# Patient Record
Sex: Female | Born: 1985
Health system: Southern US, Community
[De-identification: ages and names within clinical notes are randomized; demographics above are authoritative.]

## PROBLEM LIST (undated history)

## (undated) DIAGNOSIS — F32A Depression, unspecified: Secondary | ICD-10-CM

## (undated) DIAGNOSIS — F329 Major depressive disorder, single episode, unspecified: Secondary | ICD-10-CM

## (undated) DIAGNOSIS — N390 Urinary tract infection, site not specified: Secondary | ICD-10-CM

## (undated) HISTORY — DX: Urinary tract infection, site not specified: N39.0

## (undated) HISTORY — PX: HERNIA REPAIR: SHX51

## (undated) HISTORY — DX: Depression, unspecified: F32.A

---
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
## 2006-04-02 ENCOUNTER — Ambulatory Visit: Payer: Self-pay | Admitting: Family Medicine

## 2006-06-02 ENCOUNTER — Ambulatory Visit: Payer: Self-pay | Admitting: Family Medicine

## 2006-06-10 ENCOUNTER — Encounter: Payer: Self-pay | Admitting: Internal Medicine

## 2006-06-10 DIAGNOSIS — Z8719 Personal history of other diseases of the digestive system: Secondary | ICD-10-CM | POA: Insufficient documentation

## 2006-06-10 DIAGNOSIS — Z9889 Other specified postprocedural states: Secondary | ICD-10-CM

## 2006-06-10 HISTORY — DX: Personal history of other diseases of the digestive system: Z87.19

## 2006-06-26 ENCOUNTER — Emergency Department: Payer: Self-pay | Admitting: Emergency Medicine

## 2006-07-29 ENCOUNTER — Telehealth (INDEPENDENT_AMBULATORY_CARE_PROVIDER_SITE_OTHER): Payer: Self-pay | Admitting: *Deleted

## 2006-10-21 ENCOUNTER — Telehealth (INDEPENDENT_AMBULATORY_CARE_PROVIDER_SITE_OTHER): Payer: Self-pay | Admitting: Internal Medicine

## 2006-12-10 ENCOUNTER — Ambulatory Visit: Payer: Self-pay | Admitting: Family Medicine

## 2006-12-10 DIAGNOSIS — N925 Other specified irregular menstruation: Secondary | ICD-10-CM | POA: Insufficient documentation

## 2006-12-10 DIAGNOSIS — N949 Unspecified condition associated with female genital organs and menstrual cycle: Secondary | ICD-10-CM

## 2006-12-10 DIAGNOSIS — R3919 Other difficulties with micturition: Secondary | ICD-10-CM | POA: Insufficient documentation

## 2006-12-10 LAB — CONVERTED CEMR LAB
Beta hcg, urine, semiquantitative: POSITIVE
Ketones, urine, test strip: NEGATIVE
Nitrite: NEGATIVE
Protein, U semiquant: 30
Urobilinogen, UA: NEGATIVE

## 2006-12-27 ENCOUNTER — Encounter: Payer: Self-pay | Admitting: Maternal & Fetal Medicine

## 2007-01-10 ENCOUNTER — Encounter: Payer: Self-pay | Admitting: Maternal & Fetal Medicine

## 2007-02-24 ENCOUNTER — Inpatient Hospital Stay: Payer: Self-pay | Admitting: Obstetrics and Gynecology

## 2007-06-09 ENCOUNTER — Ambulatory Visit: Payer: Self-pay | Admitting: Family Medicine

## 2007-06-09 LAB — CONVERTED CEMR LAB: Rapid Strep: NEGATIVE

## 2007-06-16 ENCOUNTER — Observation Stay: Payer: Self-pay

## 2007-07-19 ENCOUNTER — Inpatient Hospital Stay: Payer: Self-pay

## 2009-10-10 ENCOUNTER — Encounter (INDEPENDENT_AMBULATORY_CARE_PROVIDER_SITE_OTHER): Payer: Self-pay | Admitting: *Deleted

## 2010-01-14 ENCOUNTER — Other Ambulatory Visit: Admission: RE | Admit: 2010-01-14 | Discharge: 2010-01-14 | Payer: Self-pay | Admitting: Family Medicine

## 2010-04-08 NOTE — Letter (Signed)
Summary: Nadara Eaton letter  [place] at [facility]  [address] [place], [place] [address]   Phone: [phone]  Fax: [phone]       10/10/2009 MRN: [id number]  LYGIA OLAES [address] [place], [place]  [address]  Dear Ms. [facility] announce the retirement of Arta Silence, M.D., from full-time practice at the [facility] office effective September 05, 2009 and his plans of returning part-time.  It is important to Dr. Hetty Ely and to our practice that you understand that [facility] has seven physicians in our office for your health care needs.  We will continue to offer the same exceptional care that you have today.    Dr. Hetty Ely has spoken to many of you about his plans for retirement and returning part-time in the fall.   We will continue to work with you through the transition to schedule appointments for you in the office and meet the high standards that [place] is committed to.   Again, it is with great pleasure that we share the news that Dr. Hetty Ely will return to [facility] at [facility] in October of 2011 with a reduced schedule.    If you have any questions, or would like to request an appointment with one of our physicians, please call us at [phone] and press the option for Scheduling an appointment.  We take pleasure in providing you with excellent patient care and look forward to seeing you at your next office visit.  Our [facility] Physicians are:  Tillman Abide, M.D. Laurita Quint, M.D. Roxy Manns, M.D. Kerby Nora, M.D. Hannah Beat, M.D. Ruthe Mannan, M.D. We proudly welcomed Raechel Ache, M.D. and Eustaquio Boyden, M.D. to the practice in July/August 2011.  Sincerely,   [facility]

## 2010-07-25 NOTE — Discharge Summary (Signed)
NAMEMEAGHAN, [name]:  [id number]   MEDICAL RECORD NO.:  [id number]          PATIENT TYPE:  IPS   LOCATION:  0506                          FACILITY:  BH   PHYSICIAN:  Geoffery Lyons, M.D.      DATE OF BIRTH:  1985-09-25   DATE OF ADMISSION:  05/28/2005  DATE OF DISCHARGE:  06/03/2005                                 DISCHARGE SUMMARY   CHIEF COMPLAINT AND PRESENT ILLNESS:  This was the first admission to the  adult unit but with a prior admission to the adolescent unit for this 25-  year-old single white female involuntarily committed.  Apparently, the  mother stated that her thoughts were rapid, disorganized.  She was having  pressured speech and she was threatening.   PAST PSYCHIATRIC HISTORY:  First time at [facility] but  was hospitalized at the adolescent unit March 04, 2004 to March 10, 2004.  Discharged with a diagnosis of depressive disorder not otherwise  specified, anxiety disorder not otherwise specified with generalized and  post-traumatic and panic features as well as cannabis dependence.   ALCOHOL/DRUG HISTORY:  Persistent use of marijuana.  No clear evidence of  any other substances.   MEDICAL HISTORY:  Noncontributory.   MEDICATIONS:  Trazodone 100 mg at bedtime, Zyprexa and Xanax, Adderall  (cannot remember the dose).  Questionable compliance.   MENTAL STATUS EXAM:  Fully alert female.  Upon initial evaluation, on May 29, 2005, presented to the unit, pacing, cursing, hyperverbal with racing  thoughts, requiring limit-setting.  Her behavior was somewhat bizarre and  his speech was mutter.  She continued to display tangential thinking,  observed with underpants on her head, singing loudly, saying that she was  fine, that she just needed her Adderall.  Pretty labile with stages of  laughter and hostility.   ADMISSION DIAGNOSES:  AXIS I:  Bipolar disorder, manic.  AXIS II:  No diagnosis.  AXIS III:  No diagnosis.  AXIS IV:  Moderate.  AXIS V:  GAF upon admission 25; highest GAF in the last year 60.   HOSPITAL COURSE:  She was admitted.  She was started in individual and group  psychotherapy.  She was given trazodone initially.  She was also given  Zyprexa Zydis.  She required some Geodon and Ativan and she was started on  Depakote.  Eventually, Zyprexa was changed to 10 mg in the morning and 10 mg  at night.  As already stated, she was admitted initially due to very bizarre  behavior, very labile, pressured speech, agitated, threatening, impulsive.  Wanting to leave the hospital, uncooperative, denying symptoms.  On the  25th, still irritable, labile, easily agitated, resistant to take Depakote,  somewhat tangential.  Very argumentative.  Talking out loud, singing and  then tearful.  On the 26th, she was having insomnia, racing thoughts,  agitation, pressured speech.  As she was starting to be compliant with  medication, claimed that she was ADHD and she was not bipolar, that the only  thing she needed was the Adderall.  She refused to take  the Depakote.  Endorsed that she liked the Seroquel better, so we switched from Zyprexa to  Seroquel.  Continued to have the pressured speech, agitated, some underlying  paranoia.  She was feeling that the males in the unit were looking at her.  On the 27th, she was still hypomanic, running around on the unit, somewhat  intrusive, believes another patient was stalking her.  Had to be redirected.  She wanted to be discharged on the 28th.  We contacted the mother that said  that she had seen some improvement.  She was back on Seroquel.  Willing to  take the Seroquel.  She felt ready to go home and go on with her life.  She  was indeed endorsing no suicidal or homicidal ideation.  No hallucinations.  No delusions.  Feeling better.  Objectively better.  No active suicidal or  homicidal ideation.  Starting to respond to medication.  Felt that she was  going to recover  faster if she was home.   DISCHARGE DIAGNOSES:  AXIS I:  Bipolar disorder, manic.  Attention-deficit  hyperactivity disorder.  AXIS II:  No diagnosis.  AXIS III:  No diagnosis.  AXIS IV:  Moderate.  AXIS V:  GAF upon discharge 50.   DISCHARGE MEDICATIONS:  1.  Seroquel 100 mg in the morning, 200 mg at night.  2.  Trazodone 100 mg at bedtime.   FOLLOW UP:  Chestine Spore and Jules Schick.      Geoffery Lyons, M.D.  Electronically Signed     IL/MEDQ  D:  06/21/2005  T:  06/22/2005  Job:  [id number]
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
## 2012-01-19 ENCOUNTER — Other Ambulatory Visit: Payer: Self-pay | Admitting: Family Medicine

## 2012-01-19 ENCOUNTER — Other Ambulatory Visit (HOSPITAL_COMMUNITY)
Admission: RE | Admit: 2012-01-19 | Discharge: 2012-01-19 | Disposition: A | Discharge status: Home / Self Care | Payer: Federal, State, Local not specified - PPO | Source: Ambulatory Visit | Attending: Family Medicine | Admitting: Family Medicine

## 2012-01-19 DIAGNOSIS — Z124 Encounter for screening for malignant neoplasm of cervix: Secondary | ICD-10-CM | POA: Insufficient documentation

## 2012-01-19 DIAGNOSIS — Z113 Encounter for screening for infections with a predominantly sexual mode of transmission: Secondary | ICD-10-CM | POA: Insufficient documentation

## 2013-07-02 ENCOUNTER — Emergency Department (HOSPITAL_BASED_OUTPATIENT_CLINIC_OR_DEPARTMENT_OTHER)
Admission: EM | Admit: 2013-07-02 | Discharge: 2013-07-02 | Disposition: A | Discharge status: Home / Self Care | Payer: BC Managed Care – PPO | Attending: Emergency Medicine | Admitting: Emergency Medicine

## 2013-07-02 ENCOUNTER — Encounter (HOSPITAL_BASED_OUTPATIENT_CLINIC_OR_DEPARTMENT_OTHER): Payer: Self-pay | Admitting: Emergency Medicine

## 2013-07-02 DIAGNOSIS — J3489 Other specified disorders of nose and nasal sinuses: Secondary | ICD-10-CM | POA: Insufficient documentation

## 2013-07-02 DIAGNOSIS — H109 Unspecified conjunctivitis: Secondary | ICD-10-CM | POA: Insufficient documentation

## 2013-07-02 DIAGNOSIS — Z91040 Latex allergy status: Secondary | ICD-10-CM | POA: Insufficient documentation

## 2013-07-02 DIAGNOSIS — F17200 Nicotine dependence, unspecified, uncomplicated: Secondary | ICD-10-CM | POA: Insufficient documentation

## 2013-07-02 DIAGNOSIS — Z23 Encounter for immunization: Secondary | ICD-10-CM | POA: Insufficient documentation

## 2013-07-02 MED ORDER — ERYTHROMYCIN 5 MG/GM OP OINT
TOPICAL_OINTMENT | Freq: Four times a day (QID) | OPHTHALMIC | Status: DC
  Administered 2013-07-02: 20:00:00 via OPHTHALMIC
  Filled 2013-07-02: qty 3.5

## 2013-07-02 MED ORDER — TETRACAINE HCL 0.5 % OP SOLN
1.0000 [drp] | Freq: Once | OPHTHALMIC | Status: AC
  Administered 2013-07-02: 1 [drp] via OPHTHALMIC
  Filled 2013-07-02: qty 2

## 2013-07-02 MED ORDER — TETANUS-DIPHTH-ACELL PERTUSSIS 5-2.5-18.5 LF-MCG/0.5 IM SUSP
0.5000 mL | Freq: Once | INTRAMUSCULAR | Status: AC
  Administered 2013-07-02: 0.5 mL via INTRAMUSCULAR
  Filled 2013-07-02: qty 0.5

## 2013-07-02 MED ORDER — FLUORESCEIN SODIUM 1 MG OP STRP
1.0000 | ORAL_STRIP | Freq: Once | OPHTHALMIC | Status: AC
  Administered 2013-07-02: 1 via OPHTHALMIC
  Filled 2013-07-02: qty 1

## 2013-07-02 NOTE — ED Notes (Signed)
Pt seen at walk in clinic.  Dr. Malachi Carlaykins called here and gave report.  Pt has having some confusion according to MD, pt received Toradol 60 mg IM around 1600.  MD concerned about mentation as she had to ask questions several times to pt before she answered.

## 2013-07-02 NOTE — ED Provider Notes (Signed)
CSN: [id number]     Arrival date & time 07/02/13  1724 History   First MD Initiated Contact with Patient 07/02/13 1800     Chief Complaint  Patient presents with  . Eye Pain     (Consider location/radiation/quality/duration/timing/severity/associated sxs/prior Treatment) Patient is a 28 y.o. female presenting with eye pain. The history is provided by the patient. No language interpreter was used.  Eye Pain This is a new problem. The current episode started today. Associated symptoms include congestion. Pertinent negatives include no fever. Associated symptoms comments: She had her contact lens in overnight and this morning the right eye is painful, red and feels like there is a FB in the upper lid. She has had excessive tearing but no matting or purulent drainage. Marland Kitchen.    History reviewed. No pertinent past medical history. Past Surgical History  Procedure Laterality Date  . Hernia repair     No family history on file. History  Substance Use Topics  . Smoking status: Current Every Day Smoker  . Smokeless tobacco: Not on file  . Alcohol Use: No   OB History   Grav Para Term Preterm Abortions TAB SAB Ect Mult Living                 Review of Systems  Constitutional: Negative for fever.  HENT: Positive for congestion.   Eyes: Positive for pain, redness and visual disturbance.      Allergies  Latex  Home Medications   Prior to Admission medications   Not on File   BP 118/65  Pulse 78  Temp(Src) 98 F (36.7 C)  Resp 14  Ht 5\' 8"  (1.727 m)  Wt 170 lb (77.111 kg)  BMI 25.85 kg/m2  SpO2 100%  LMP 06/30/2013 Physical Exam  Constitutional: She appears well-developed and well-nourished. No distress.  HENT:  No facial swelling.  Eyes: Conjunctivae are normal. Pupils are equal, round, and reactive to light.  Right eye injected, tearing. Questionable mild chemosis. No FB observed. No corneal abrasion on fluorescein uptake.   Pulmonary/Chest: Effort normal.    ED  Course  Procedures (including critical care time) Labs Review Labs Reviewed - No data to display  Imaging Review No results found.   EKG Interpretation None      MDM   Final diagnoses:  None    1. Conjunctivitis  No FB observed. Eye is generally red. No evidence iritis or corneal abrasion. Will give abx ointment and encourage F/U with her eye doctor if symptoms persist. No contacts for a week.     Arnoldo HookerShari A Lenorris Karger, PA-C 07/02/13 2335

## 2013-07-02 NOTE — ED Notes (Signed)
Slept in her contact lenses last night.  Her right eye itched this morning, she scratched it.  C/o pain ever since. Reports the eye has been draining as well.

## 2013-07-02 NOTE — Discharge Instructions (Signed)

## 2013-07-05 NOTE — ED Provider Notes (Signed)
Medical screening examination/treatment/procedure(s) were performed by non-physician practitioner and as supervising physician I was immediately available for consultation/collaboration.   EKG Interpretation None        Rolan BuccoMelanie Annalaura Sauseda, MD 07/05/13 [phone]

## 2014-07-07 ENCOUNTER — Encounter (HOSPITAL_BASED_OUTPATIENT_CLINIC_OR_DEPARTMENT_OTHER): Payer: Self-pay | Admitting: *Deleted

## 2014-07-07 ENCOUNTER — Emergency Department (HOSPITAL_BASED_OUTPATIENT_CLINIC_OR_DEPARTMENT_OTHER)
Admission: EM | Admit: 2014-07-07 | Discharge: 2014-07-07 | Disposition: A | Discharge status: Home / Self Care | Payer: BLUE CROSS/BLUE SHIELD | Attending: Emergency Medicine | Admitting: Emergency Medicine

## 2014-07-07 ENCOUNTER — Emergency Department (HOSPITAL_BASED_OUTPATIENT_CLINIC_OR_DEPARTMENT_OTHER): Payer: BLUE CROSS/BLUE SHIELD

## 2014-07-07 DIAGNOSIS — S8002XA Contusion of left knee, initial encounter: Secondary | ICD-10-CM | POA: Insufficient documentation

## 2014-07-07 DIAGNOSIS — Z720 Tobacco use: Secondary | ICD-10-CM | POA: Insufficient documentation

## 2014-07-07 DIAGNOSIS — Y9389 Activity, other specified: Secondary | ICD-10-CM | POA: Diagnosis not present

## 2014-07-07 DIAGNOSIS — Z91040 Latex allergy status: Secondary | ICD-10-CM | POA: Insufficient documentation

## 2014-07-07 DIAGNOSIS — S4991XA Unspecified injury of right shoulder and upper arm, initial encounter: Secondary | ICD-10-CM | POA: Diagnosis not present

## 2014-07-07 DIAGNOSIS — Y9289 Other specified places as the place of occurrence of the external cause: Secondary | ICD-10-CM | POA: Insufficient documentation

## 2014-07-07 DIAGNOSIS — S1081XA Abrasion of other specified part of neck, initial encounter: Secondary | ICD-10-CM | POA: Diagnosis not present

## 2014-07-07 DIAGNOSIS — S86812A Strain of other muscle(s) and tendon(s) at lower leg level, left leg, initial encounter: Secondary | ICD-10-CM | POA: Insufficient documentation

## 2014-07-07 DIAGNOSIS — S8392XA Sprain of unspecified site of left knee, initial encounter: Secondary | ICD-10-CM | POA: Insufficient documentation

## 2014-07-07 DIAGNOSIS — S29092A Other injury of muscle and tendon of back wall of thorax, initial encounter: Secondary | ICD-10-CM | POA: Diagnosis not present

## 2014-07-07 DIAGNOSIS — S299XXA Unspecified injury of thorax, initial encounter: Secondary | ICD-10-CM | POA: Diagnosis present

## 2014-07-07 DIAGNOSIS — S2231XA Fracture of one rib, right side, initial encounter for closed fracture: Secondary | ICD-10-CM | POA: Diagnosis not present

## 2014-07-07 DIAGNOSIS — Y998 Other external cause status: Secondary | ICD-10-CM | POA: Insufficient documentation

## 2014-07-07 MED ORDER — IBUPROFEN 800 MG PO TABS: 800.0000 mg | ORAL_TABLET | Freq: Three times a day (TID) | ORAL | Status: DC

## 2014-07-07 MED ORDER — TRAMADOL HCL 50 MG PO TABS: 50.0000 mg | ORAL_TABLET | Freq: Four times a day (QID) | ORAL | Status: DC | PRN

## 2014-07-07 NOTE — ED Notes (Signed)
MD at bedside. 
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
## 2014-07-07 NOTE — Discharge Instructions (Signed)
Joint Sprain A sprain is a tear or stretch in the ligaments that hold a joint together. Severe sprains may need as long as 3-6 weeks of immobilization and/or exercises to heal completely. Sprained joints should be rested and protected. If not, they can become unstable and prone to re-injury. Proper treatment can reduce your pain, shorten the period of disability, and reduce the risk of repeated injuries. TREATMENT   Rest and elevate the injured joint to reduce pain and swelling.  Apply ice packs to the injury for 20-30 minutes every 2-3 hours for the next 2-3 days.  Keep the injury wrapped in a compression bandage or splint as long as the joint is painful or as instructed by your caregiver.  Do not use the injured joint until it is completely healed to prevent re-injury and chronic instability. Follow the instructions of your caregiver.  Long-term sprain management may require exercises and/or treatment by a physical therapist. Taping or special braces may help stabilize the joint until it is completely better. SEEK MEDICAL CARE IF:   You develop increased pain or swelling of the joint.  You develop increasing redness and warmth of the joint.  You develop a fever.  It becomes stiff.  Your hand or foot gets cold or numb. Document Released: 04/02/2004 Document Revised: 05/18/2011 Document Reviewed: 03/12/2008 [facility] Patient Information 2015 [place], [place]. This information is not intended to replace advice given to you by your health care provider. Make sure you discuss any questions you have with your health care provider.  Rib Fracture A rib fracture is a break or crack in one of the bones of the ribs. The ribs are a group of long, curved bones that wrap around your chest and attach to your spine. They protect your lungs and other organs in the chest cavity. A broken or cracked rib is often painful, but most do not cause other problems. Most rib fractures heal on their own over time.  However, rib fractures can be more serious if multiple ribs are broken or if broken ribs move out of place and push against other structures. CAUSES   A direct blow to the chest. For example, this could happen during contact sports, a car accident, or a fall against a hard object.  Repetitive movements with high force, such as pitching a baseball or having severe coughing spells. SYMPTOMS   Pain when you breathe in or cough.  Pain when someone presses on the injured area. DIAGNOSIS  Your caregiver will perform a physical exam. Various imaging tests may be ordered to confirm the diagnosis and to look for related injuries. These tests may include a chest X-ray, computed tomography (CT), magnetic resonance imaging (MRI), or a bone scan. TREATMENT  Rib fractures usually heal on their own in 1-3 months. The longer healing period is often associated with a continued cough or other aggravating activities. During the healing period, pain control is very important. Medication is usually given to control pain. Hospitalization or surgery may be needed for more severe injuries, such as those in which multiple ribs are broken or the ribs have moved out of place.  HOME CARE INSTRUCTIONS   Avoid strenuous activity and any activities or movements that cause pain. Be careful during activities and avoid bumping the injured rib.  Gradually increase activity as directed by your caregiver.  Only take over-the-counter or prescription medications as directed by your caregiver. Do not take other medications without asking your caregiver first.  Apply ice to the injured  area for the first 1-2 days after you have been treated or as directed by your caregiver. Applying ice helps to reduce inflammation and pain.  Put ice in a plastic bag.  Place a towel between your skin and the bag.   Leave the ice on for 15-20 minutes at a time, every 2 hours while you are awake.  Perform deep breathing as directed by your  caregiver. This will help prevent pneumonia, which is a common complication of a broken rib. Your caregiver may instruct you to:  Take deep breaths several times a day.  Try to cough several times a day, holding a pillow against the injured area.  Use a device called an incentive spirometer to practice deep breathing several times a day.  Drink enough fluids to keep your urine clear or pale yellow. This will help you avoid constipation.   Do not wear a rib belt or binder. These restrict breathing, which can lead to pneumonia.  SEEK IMMEDIATE MEDICAL CARE IF:   You have a fever.   You have difficulty breathing or shortness of breath.   You develop a continual cough, or you cough up thick or bloody sputum.  You feel sick to your stomach (nausea), throw up (vomit), or have abdominal pain.   You have worsening pain not controlled with medications.  MAKE SURE YOU:  Understand these instructions.  Will watch your condition.  Will get help right away if you are not doing well or get worse. Document Released: 02/23/2005 Document Revised: 10/26/2012 Document Reviewed: 04/27/2012 [facility] Patient Information 2015 [place], [place]. This information is not intended to replace advice given to you by your health care provider. Make sure you discuss any questions you have with your health care provider.
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
## 2018-12-26 ENCOUNTER — Other Ambulatory Visit: Payer: Self-pay

## 2018-12-26 ENCOUNTER — Encounter: Payer: Self-pay | Admitting: Primary Care

## 2018-12-26 ENCOUNTER — Ambulatory Visit (INDEPENDENT_AMBULATORY_CARE_PROVIDER_SITE_OTHER): Payer: No Typology Code available for payment source | Admitting: Primary Care

## 2018-12-26 DIAGNOSIS — N390 Urinary tract infection, site not specified: Secondary | ICD-10-CM | POA: Insufficient documentation

## 2018-12-26 DIAGNOSIS — F329 Major depressive disorder, single episode, unspecified: Secondary | ICD-10-CM

## 2018-12-26 DIAGNOSIS — Z8719 Personal history of other diseases of the digestive system: Secondary | ICD-10-CM

## 2018-12-26 DIAGNOSIS — Z9889 Other specified postprocedural states: Secondary | ICD-10-CM | POA: Diagnosis not present

## 2018-12-26 DIAGNOSIS — F32A Depression, unspecified: Secondary | ICD-10-CM

## 2018-12-26 HISTORY — DX: Urinary tract infection, site not specified: N39.0

## 2018-12-26 NOTE — Assessment & Plan Note (Signed)
Recurrent in 2019 and early 2020, no recent UTI. Continue to monitor, continue with proper intake.

## 2018-12-26 NOTE — Patient Instructions (Signed)
It was a pleasure to meet you today! Please don't hesitate to call or message me with any questions. Welcome to [facility]!   

## 2018-12-26 NOTE — Assessment & Plan Note (Signed)
Sounds to be inguinal hernias that were repaired during childhood. Small pea sized bulge noted on exam without tenderness.  Since the newer bulge has been stable and unchanged for two years and is also not bothersome we will continue to monitor. She agrees.

## 2018-12-26 NOTE — Assessment & Plan Note (Signed)
Prior history, no recent recurrence. Denies concerns today, continue to monitor.

## 2018-12-26 NOTE — Progress Notes (Signed)
Subjective:    Patient ID: Shari Gallagher, female    DOB: Dec 06, 1985, 33 y.o.   MRN: [id number]  HPI  Shari Gallagher is a 33 year old female who presents today to establish care and discuss the problems mentioned below. Will obtain/review records.  1) Hernia: History of bilateral inguinal hernia which was repaired in 1995 with mesh. She has noticed a bulge to the left groin over the last two years, stable overall. She denies pain.   2) Bipolar Depression: Prior history of depression and was treated with Seroquel, Abilify, Trazodone, Xanax, Adderall at various times. Has been off of treatment for 10 years and is doing well.   3) Recurrent UTI: Chronic and intermittent since mid 2019. Three documented E coli positive cultures of acute cystitis since November 2019. Her last UTI was 3-4 months ago. During her CPE in March 2020 her previous PCP prescribed a 90 day supply of Macrobid because of recurrent positive cultre UTI. She really didn't take this as she decided to increase her water intake and has not had recurrent UTI since.   BP: 116/74    Review of Systems  Gastrointestinal: Negative for abdominal pain.  Genitourinary: Negative for dysuria and frequency.  Psychiatric/Behavioral:       Denies concerns for depression       Past Medical History:  Diagnosis Date  . Depression   . Urinary tract infection      Social History   Socioeconomic History  . Marital status: Single    Spouse name: Not on file  . Number of children: Not on file  . Years of education: Not on file  . Highest education level: Not on file  Occupational History  . Not on file  Social Needs  . Financial resource strain: Not on file  . Food insecurity    Worry: Not on file    Inability: Not on file  . Transportation needs    Medical: Not on file    Non-medical: Not on file  Tobacco Use  . Smoking status: Current Every Day Smoker    Packs/day: 1.00  . Smokeless tobacco: Never Used  Substance and  Sexual Activity  . Alcohol use: No  . Drug use: Not on file  . Sexual activity: Never    Birth control/protection: None  Lifestyle  . Physical activity    Days per week: Not on file    Minutes per session: Not on file  . Stress: Not on file  Relationships  . Social Herbalist on phone: Not on file    Gets together: Not on file    Attends religious service: Not on file    Active member of club or organization: Not on file    Attends meetings of clubs or organizations: Not on file    Relationship status: Not on file  . Intimate partner violence    Fear of current or ex partner: Not on file    Emotionally abused: Not on file    Physically abused: Not on file    Forced sexual activity: Not on file  Other Topics Concern  . Not on file  Social History Narrative  . Not on file    Past Surgical History:  Procedure Laterality Date  . HERNIA REPAIR  1995    Family History  Problem Relation Age of Onset  . Depression Mother   . Hypertension Mother   . Depression Father   . Diabetes Father   .  Hyperlipidemia Father   . Heart disease Maternal Grandfather   . Breast cancer Paternal Grandmother   . Lung cancer Paternal Grandfather   . Heart disease Paternal Grandfather     Allergies  Allergen Reactions  . Latex Rash    No current outpatient medications on file prior to visit.   No current facility-administered medications on file prior to visit.     BP 116/74   Pulse 73   Temp 97.9 F (36.6 C) (Temporal)   Ht 5\' 7"  (1.702 m)   Wt 221 lb 12 oz (100.6 kg)   LMP 12/19/2018   SpO2 100%   BMI 34.73 kg/m    Objective:   Physical Exam  Constitutional: She appears well-nourished.  Neck: Neck supple.  Cardiovascular: Normal rate and regular rhythm.  Respiratory: Effort normal and breath sounds normal.  Skin: Skin is warm and dry.  Psychiatric: She has a normal mood and affect.           Assessment & Plan:

## 2019-10-09 ENCOUNTER — Ambulatory Visit (INDEPENDENT_AMBULATORY_CARE_PROVIDER_SITE_OTHER): Payer: No Typology Code available for payment source | Admitting: Primary Care

## 2019-10-09 ENCOUNTER — Other Ambulatory Visit: Payer: Self-pay

## 2019-10-09 ENCOUNTER — Encounter: Payer: Self-pay | Admitting: Primary Care

## 2019-10-09 VITALS — BP 116/78 | HR 78 | Temp 96.0°F | Ht 67.0 in | Wt 224.8 lb

## 2019-10-09 DIAGNOSIS — R102 Pelvic and perineal pain: Secondary | ICD-10-CM | POA: Diagnosis not present

## 2019-10-09 DIAGNOSIS — N920 Excessive and frequent menstruation with regular cycle: Secondary | ICD-10-CM | POA: Diagnosis not present

## 2019-10-09 LAB — POC URINALSYSI DIPSTICK (AUTOMATED)
Bilirubin, UA: NEGATIVE
Blood, UA: NEGATIVE
Glucose, UA: NEGATIVE
Leukocytes, UA: NEGATIVE
Nitrite, UA: NEGATIVE
Protein, UA: NEGATIVE
Spec Grav, UA: 1.025 (ref 1.010–1.025)
Urobilinogen, UA: 0.2 E.U./dL
pH, UA: 6 (ref 5.0–8.0)

## 2019-10-09 NOTE — Progress Notes (Signed)
Subjective:    Patient ID: Shari Gallagher, female    DOB: [birth year], 34 y.o.   MRN: [id number]  HPI  This visit occurred during the SARS-CoV-2 public health emergency.  Safety protocols were in place, including screening questions prior to the visit, additional usage of staff PPE, and extensive cleaning of exam room while observing appropriate contact time as indicated for disinfecting solutions.   Shari Gallagher is a 34 year old female with a history of recurrent UTI, tobaco abuse, hernia repair who presents today with a chief complaint of pelvic pain.  Her pain is located to the bilateral upper pelvis for which she describes as an "electrocution" type pain. The pain is intermittent which began about one month ago. She also reports lower abdominal bloating and gas which began about the same time.  She has regular bowel movements once daily. Menstrual cycles are typically occur once monthly lasting 5-7 days, heavy for several days, but heavier than usual over the last month. She denies nausea, vomiting, diarrhea, bloody stools, family history of endometriosis or uterine fibroids, vaginal discharge/itching.  She underwent bilateral inguinal hernia repair during infancy.   Review of Systems  Constitutional: Negative for fever.  Gastrointestinal: Negative for blood in stool, constipation, diarrhea, nausea and vomiting.  Genitourinary: Positive for pelvic pain. Negative for dysuria, hematuria and vaginal discharge.       Menorrhagia with normal cycle       Past Medical History:  Diagnosis Date  . Depression   . Urinary tract infection      Social History   Socioeconomic History  . Marital status: Single    Spouse name: Not on file  . Number of children: Not on file  . Years of education: Not on file  . Highest education level: Not on file  Occupational History  . Not on file  Tobacco Use  . Smoking status: Current Every Day Smoker    Packs/day: 1.00  . Smokeless tobacco: Never  Used  Substance and Sexual Activity  . Alcohol use: No  . Drug use: Not on file  . Sexual activity: Never    Birth control/protection: None  Other Topics Concern  . Not on file  Social History Narrative  . Not on file   Social Determinants of Health   Financial Resource Strain:   . Difficulty of Paying Living Expenses:   Food Insecurity:   . Worried About Programme researcher, broadcasting/film/video in the Last Year:   . Barista in the Last Year:   Transportation Needs:   . Freight forwarder (Medical):   Marland Kitchen Lack of Transportation (Non-Medical):   Physical Activity:   . Days of Exercise per Week:   . Minutes of Exercise per Session:   Stress:   . Feeling of Stress :   Social Connections:   . Frequency of Communication with Friends and Family:   . Frequency of Social Gatherings with Friends and Family:   . Attends Religious Services:   . Active Member of Clubs or Organizations:   . Attends Banker Meetings:   Marland Kitchen Marital Status:   Intimate Partner Violence:   . Fear of Current or Ex-Partner:   . Emotionally Abused:   Marland Kitchen Physically Abused:   . Sexually Abused:     Past Surgical History:  Procedure Laterality Date  . HERNIA REPAIR  1995    Family History  Problem Relation Age of Onset  . Depression Mother   . Hypertension Mother   .  Depression Father   . Diabetes Father   . Hyperlipidemia Father   . Heart disease Maternal Grandfather   . Breast cancer Paternal Grandmother   . Lung cancer Paternal Grandfather   . Heart disease Paternal Grandfather     Allergies  Allergen Reactions  . Latex Rash    No current outpatient medications on file prior to visit.   No current facility-administered medications on file prior to visit.    BP 116/78   Pulse 78   Temp (!) 96 F (35.6 C) (Temporal)   Ht 5\' 7"  (1.702 m)   Wt (!) 224 lb 12 oz (101.9 kg)   LMP 09/20/2019   SpO2 98%   BMI 35.20 kg/m    Objective:   Physical Exam Cardiovascular:     Rate and  Rhythm: Normal rate and regular rhythm.  Pulmonary:     Effort: Pulmonary effort is normal.     Breath sounds: Normal breath sounds.  Abdominal:     General: Bowel sounds are normal.     Palpations: Abdomen is soft.     Comments: Pressure to suprapubic region with palpation.   Musculoskeletal:     Cervical back: Neck supple.  Skin:    General: Skin is warm and dry.            Assessment & Plan:

## 2019-10-09 NOTE — Patient Instructions (Signed)
You will be contacted regarding your ultrasound.  Please let us know if you have not been contacted within two weeks.   It was a pleasure to see you today!  

## 2019-10-09 NOTE — Assessment & Plan Note (Signed)
Over the last one month, with pelvic pain.  Transvaginal and pelvic ultrasound ordered and pending.   UA today negative. She denies vaginal discharge/itching, no pelvic exam completed.  Await results.

## 2019-10-09 NOTE — Assessment & Plan Note (Signed)
Unclear etiology, could be nerve type pain?  Checking transvaginal/pelvic ultrasound.   UA today negative.

## 2019-10-11 ENCOUNTER — Telehealth: Payer: Self-pay | Admitting: Primary Care

## 2019-10-11 ENCOUNTER — Ambulatory Visit
Admission: RE | Admit: 2019-10-11 | Discharge: 2019-10-11 | Disposition: A | Discharge status: Home / Self Care | Payer: No Typology Code available for payment source | Source: Ambulatory Visit | Attending: Primary Care | Admitting: Primary Care

## 2019-10-11 DIAGNOSIS — R102 Pelvic and perineal pain: Secondary | ICD-10-CM

## 2019-10-11 DIAGNOSIS — N920 Excessive and frequent menstruation with regular cycle: Secondary | ICD-10-CM

## 2019-10-11 NOTE — Telephone Encounter (Signed)
Patient call and wanted Shari Gallagher to know she had her ultrasound and the should be reaching out to you in the next day or so with those results.

## 2019-10-11 NOTE — Telephone Encounter (Signed)
Noted. Mayra Reel is the provider who ordered the [place] and will contact patient after she review.

## 2019-10-25 ENCOUNTER — Telehealth: Payer: Self-pay | Admitting: Primary Care

## 2019-10-25 NOTE — Telephone Encounter (Signed)
Please get more information. What is on her left hand?  Why is she wanting the referral?

## 2019-10-25 NOTE — Telephone Encounter (Signed)
Pt said she needs a referral to dermatology for her hands. She said it is on left hand.

## 2019-10-26 NOTE — Telephone Encounter (Signed)
Per DPR, left detail message of Kate Clark's comments for patient to call back 

## 2019-11-01 NOTE — Telephone Encounter (Signed)
Per DPR, left detail message of Kate Clark's comments for patient to call back 

## 2019-11-02 ENCOUNTER — Other Ambulatory Visit: Payer: Self-pay | Admitting: Obstetrics & Gynecology

## 2020-01-12 ENCOUNTER — Other Ambulatory Visit: Payer: Self-pay | Admitting: Obstetrics & Gynecology

## 2020-05-24 DIAGNOSIS — L600 Ingrowing nail: Secondary | ICD-10-CM | POA: Diagnosis not present

## 2020-05-24 DIAGNOSIS — M7752 Other enthesopathy of left foot: Secondary | ICD-10-CM | POA: Diagnosis not present

## 2020-05-24 DIAGNOSIS — L03032 Cellulitis of left toe: Secondary | ICD-10-CM | POA: Diagnosis not present

## 2020-05-24 DIAGNOSIS — M79675 Pain in left toe(s): Secondary | ICD-10-CM | POA: Diagnosis not present

## 2020-06-06 DIAGNOSIS — Z79899 Other long term (current) drug therapy: Secondary | ICD-10-CM | POA: Diagnosis not present

## 2020-06-14 DIAGNOSIS — M79675 Pain in left toe(s): Secondary | ICD-10-CM | POA: Diagnosis not present

## 2020-07-23 DIAGNOSIS — L600 Ingrowing nail: Secondary | ICD-10-CM | POA: Diagnosis not present

## 2020-07-23 DIAGNOSIS — B351 Tinea unguium: Secondary | ICD-10-CM | POA: Diagnosis not present

## 2020-07-30 DIAGNOSIS — B351 Tinea unguium: Secondary | ICD-10-CM | POA: Diagnosis not present

## 2020-10-14 ENCOUNTER — Encounter: Payer: Self-pay | Admitting: Physician Assistant

## 2020-10-14 ENCOUNTER — Telehealth: Payer: 59 | Admitting: Physician Assistant

## 2020-10-14 DIAGNOSIS — J069 Acute upper respiratory infection, unspecified: Secondary | ICD-10-CM | POA: Diagnosis not present

## 2020-10-14 MED ORDER — IPRATROPIUM BROMIDE 0.03 % NA SOLN: 2.0000 | Freq: Two times a day (BID) | NASAL | 12 refills | Status: DC

## 2020-10-14 MED ORDER — BENZONATATE 100 MG PO CAPS: 100.0000 mg | ORAL_CAPSULE | Freq: Three times a day (TID) | ORAL | 0 refills | Status: DC | PRN

## 2020-10-14 MED ORDER — ALBUTEROL SULFATE HFA 108 (90 BASE) MCG/ACT IN AERS: 2.0000 | INHALATION_SPRAY | Freq: Four times a day (QID) | RESPIRATORY_TRACT | 0 refills | Status: DC | PRN

## 2020-10-14 NOTE — Patient Instructions (Signed)
  Shari Gallagher, thank you for joining [name], PA-C for today's virtual visit.  While this provider is not your primary care provider (PCP), if your PCP is located in our provider database this encounter information will be shared with them immediately following your visit.  Consent: (Patient) Shari Gallagher provided verbal consent for this virtual visit at the beginning of the encounter.  Current Medications:  Current Outpatient Medications:    albuterol (VENTOLIN HFA) 108 (90 Base) MCG/ACT inhaler, Inhale 2 puffs into the lungs every 6 (six) hours as needed for wheezing or shortness of breath., Disp: 8 g, Rfl: 0   benzonatate (TESSALON) 100 MG capsule, Take 1 capsule (100 mg total) by mouth 3 (three) times daily as needed., Disp: 30 capsule, Rfl: 0   ipratropium (ATROVENT) 0.03 % nasal spray, Place 2 sprays into both nostrils every 12 (twelve) hours., Disp: 30 mL, Rfl: 12   Medications ordered in this encounter:  Meds ordered this encounter  Medications   benzonatate (TESSALON) 100 MG capsule    Sig: Take 1 capsule (100 mg total) by mouth 3 (three) times daily as needed.    Dispense:  30 capsule    Refill:  0    Order Specific Question:   Supervising Provider    Answer:   MILLER, BRIAN [[id number]]   ipratropium (ATROVENT) 0.03 % nasal spray    Sig: Place 2 sprays into both nostrils every 12 (twelve) hours.    Dispense:  30 mL    Refill:  12    Order Specific Question:   Supervising Provider    Answer:   Hyacinth Meeker, BRIAN [[id number]]   albuterol (VENTOLIN HFA) 108 (90 Base) MCG/ACT inhaler    Sig: Inhale 2 puffs into the lungs every 6 (six) hours as needed for wheezing or shortness of breath.    Dispense:  8 g    Refill:  0    Order Specific Question:   Supervising Provider    Answer:   Hyacinth Meeker, BRIAN [[id number]]     *If you need refills on other medications prior to your next appointment, please contact your pharmacy*  Follow-Up: Call back or seek an in-person evaluation if the  symptoms worsen or if the condition fails to improve as anticipated.  Other Instructions Continue Zyrtec. Can add flonase OTC if desired   If you have been instructed to have an in-person evaluation today at a local Urgent Care facility, please use the link below. It will take you to a list of all of our available [facility], including address, phone number and hours of operation. Please do not delay care.  [facility]  If you or a family member do not have a primary care provider, use the link below to schedule a visit and establish care. When you choose a [facility] primary care physician or advanced practice provider, you gain a long-term partner in health. Find a Primary Care Provider  Learn more about [facility]'s in-office and virtual care options: [facility] [facility] - Get Care Now

## 2020-10-14 NOTE — Progress Notes (Signed)
Virtual Visit Consent   [name], you are scheduled for a virtual visit with a [facility] provider today.     Just as with appointments in the office, your consent must be obtained to participate.  Your consent will be active for this visit and any virtual visit you may have with one of our providers in the next 365 days.     If you have a MyChart account, a copy of this consent can be sent to you electronically.  All virtual visits are billed to your insurance company just like a traditional visit in the office.    As this is a virtual visit, video technology does not allow for your provider to perform a traditional examination.  This may limit your provider's ability to fully assess your condition.  If your provider identifies any concerns that need to be evaluated in person or the need to arrange testing (such as labs, EKG, etc.), we will make arrangements to do so.     Although advances in technology are sophisticated, we cannot ensure that it will always work on either your end or our end.  If the connection with a video visit is poor, the visit may have to be switched to a telephone visit.  With either a video or telephone visit, we are not always able to ensure that we have a secure connection.     I need to obtain your verbal consent now.   Are you willing to proceed with your visit today?    Shari Gallagher has provided verbal consent on 10/14/2020 for a virtual visit (video or telephone).   [name], PA-C   Date: 10/14/2020 12:19 PM   Virtual Visit via Video Note   I, [name], connected with  Shari Gallagher  ([id number], 05-Feb-1986) on 10/14/20 at 12:15 PM EDT by a video-enabled telemedicine application and verified that I am speaking with the correct person using two identifiers.  Location: Patient: Virtual Visit Location Patient: Home Provider: Virtual Visit Location Provider: Home Office   I discussed the limitations of evaluation and management by  telemedicine and the availability of in person appointments. The patient expressed understanding and agreed to proceed.    History of Present Illness: Shari Gallagher is a 35 y.o. who identifies as a female who was assigned female at birth, and is being seen today for URI symptoms.  HPI: URI  This is a new problem. The current episode started in the past 7 days (Saturday). There has been no fever. Associated symptoms include coughing (dry), rhinorrhea, sneezing and a sore throat (scratchy). Pertinent negatives include no ear pain, headaches, plugged ear sensation, sinus pain or wheezing. Associated symptoms comments: Post nasal drainage. She has tried increased fluids and sleep (zyrtec, mucinex) for the symptoms. The treatment provided no relief.     Problems:  Patient Active Problem List   Diagnosis Date Noted   Menorrhagia with regular cycle 10/09/2019   Pelvic pain 10/09/2019   Recurrent UTI 12/26/2018   SYMPTOM, ABNORMALITY, URINATION NEC 12/10/2006   History of hernia repair 06/10/2006   Depression 05/12/2005    Allergies:  Allergies  Allergen Reactions   Latex Rash   Medications:  Current Outpatient Medications:    albuterol (VENTOLIN HFA) 108 (90 Base) MCG/ACT inhaler, Inhale 2 puffs into the lungs every 6 (six) hours as needed for wheezing or shortness of breath., Disp: 8 g, Rfl: 0   benzonatate (TESSALON) 100 MG capsule, Take 1 capsule (  100 mg total) by mouth 3 (three) times daily as needed., Disp: 30 capsule, Rfl: 0   ipratropium (ATROVENT) 0.03 % nasal spray, Place 2 sprays into both nostrils every 12 (twelve) hours., Disp: 30 mL, Rfl: 12  Observations/Objective: Patient is well-developed, well-nourished in no acute distress.  Resting comfortably at home.  Head is normocephalic, atraumatic.  No labored breathing.  Speech is clear and coherent with logical content.  Patient is alert and oriented at baseline.    Assessment and Plan: 1. Viral URI with cough -  benzonatate (TESSALON) 100 MG capsule; Take 1 capsule (100 mg total) by mouth 3 (three) times daily as needed.  Dispense: 30 capsule; Refill: 0 - ipratropium (ATROVENT) 0.03 % nasal spray; Place 2 sprays into both nostrils every 12 (twelve) hours.  Dispense: 30 mL; Refill: 12 - albuterol (VENTOLIN HFA) 108 (90 Base) MCG/ACT inhaler; Inhale 2 puffs into the lungs every 6 (six) hours as needed for wheezing or shortness of breath.  Dispense: 8 g; Refill: 0  - Worsening symptoms that have not responded to OTC medications.  - Add tessalon perles for cough, albuterol for wheezing and SOB, and Ipratropium for rhinorrhea and post nasal drainage - Continue allergy medications.  - Stay well hydrated and get plenty of rest.  - Seek in person evaluation if no symptom improvement or if symptoms worsen.  Follow Up Instructions: I discussed the assessment and treatment plan with the patient. The patient was provided an opportunity to ask questions and all were answered. The patient agreed with the plan and demonstrated an understanding of the instructions.  A copy of instructions were sent to the patient via MyChart.  The patient was advised to call back or seek an in-person evaluation if the symptoms worsen or if the condition fails to improve as anticipated.  Time:  I spent 10 minutes with the patient via telehealth technology discussing the above problems/concerns.    [name], PA-C

## 2020-12-04 DIAGNOSIS — H5213 Myopia, bilateral: Secondary | ICD-10-CM | POA: Diagnosis not present

## 2021-01-07 DIAGNOSIS — Z01411 Encounter for gynecological examination (general) (routine) with abnormal findings: Secondary | ICD-10-CM | POA: Diagnosis not present

## 2021-01-07 DIAGNOSIS — Z113 Encounter for screening for infections with a predominantly sexual mode of transmission: Secondary | ICD-10-CM | POA: Diagnosis not present

## 2021-01-07 DIAGNOSIS — Z3040 Encounter for surveillance of contraceptives, unspecified: Secondary | ICD-10-CM | POA: Diagnosis not present

## 2021-01-07 DIAGNOSIS — L723 Sebaceous cyst: Secondary | ICD-10-CM | POA: Diagnosis not present

## 2021-01-07 DIAGNOSIS — Z6834 Body mass index (BMI) 34.0-34.9, adult: Secondary | ICD-10-CM | POA: Diagnosis not present

## 2021-02-16 DIAGNOSIS — R300 Dysuria: Secondary | ICD-10-CM | POA: Diagnosis not present

## 2021-02-25 ENCOUNTER — Telehealth: Payer: 59

## 2021-02-25 DIAGNOSIS — N390 Urinary tract infection, site not specified: Secondary | ICD-10-CM | POA: Diagnosis not present

## 2021-03-19 ENCOUNTER — Telehealth: Payer: 59

## 2021-04-02 ENCOUNTER — Encounter: Payer: 59 | Admitting: Primary Care

## 2021-11-26 ENCOUNTER — Telehealth: Payer: No Typology Code available for payment source | Admitting: Physician Assistant

## 2021-11-26 DIAGNOSIS — H44001 Unspecified purulent endophthalmitis, right eye: Secondary | ICD-10-CM | POA: Diagnosis not present

## 2021-11-26 MED ORDER — POLYMYXIN B-TRIMETHOPRIM 10000-0.1 UNIT/ML-% OP SOLN: 1.0000 [drp] | OPHTHALMIC | 0 refills | Status: DC

## 2021-11-26 NOTE — Patient Instructions (Signed)
Shari Gallagher, thank you for joining [name], PA-C for today's virtual visit.  While this provider is not your primary care provider (PCP), if your PCP is located in our provider database this encounter information will be shared with them immediately following your visit.  Consent: (Patient) Shari Gallagher provided verbal consent for this virtual visit at the beginning of the encounter.  Current Medications:  Current Outpatient Medications:    trimethoprim-polymyxin b (POLYTRIM) ophthalmic solution, Place 1 drop into the right eye every 4 (four) hours. X 5 days, Disp: 10 mL, Rfl: 0   albuterol (VENTOLIN HFA) 108 (90 Base) MCG/ACT inhaler, Inhale 2 puffs into the lungs every 6 (six) hours as needed for wheezing or shortness of breath., Disp: 8 g, Rfl: 0   Medications ordered in this encounter:  Meds ordered this encounter  Medications   trimethoprim-polymyxin b (POLYTRIM) ophthalmic solution    Sig: Place 1 drop into the right eye every 4 (four) hours. X 5 days    Dispense:  10 mL    Refill:  0    Order Specific Question:   Supervising Provider    Answer:   Merrilee Jansky [[id number]]     *If you need refills on other medications prior to your next appointment, please contact your pharmacy*  Follow-Up: Call back or seek an in-person evaluation if the symptoms worsen or if the condition fails to improve as anticipated.  Other Instructions  Polymyxin B; Trimethoprim Eye Solution What is this medication? POLYMYXIN B; TRIMETHOPRIM (pol i MIX in B; trye METH oh prim) treats eye infections caused by bacteria. It belongs to a group of medications called antibiotics. It will not treat infections caused by viruses. This medicine may be used for other purposes; ask your health care provider or pharmacist if you have questions. COMMON BRAND NAME(S): Polytrim What should I tell my care team before I take this medication? They need to know if you have any of these  conditions: Wear contact lenses An unusual or allergic reaction to polymyxin B, trimethoprim, other medications, foods, dyes, or preservatives Pregnant or trying to get pregnant Breast-feeding How should I use this medication? This medication is used in the eye. Follow the directions on the prescription label. Wash your hands before and after use. Tilt your head back slightly. Pull your lower eyelid down gently to form a pouch. Do not touch the tip of the dropper to your eye, fingertips, or other surface. Squeeze the prescribed number of drops into the pouch. Close the eye gently to spread the drops. Use your medication at regular intervals. Do not take your medication more often than directed. Use all of your medication as directed even if you think you are better. Do not skip doses or stop your medication early. Talk to your care team about the use of this medication in children. While this medication may be prescribed for children and infants for selected conditions, precautions do apply. Overdosage: If you think you have taken too much of this medicine contact a poison control center or emergency room at once. NOTE: This medicine is only for you. Do not share this medicine with others. What if I miss a dose? If you miss a dose, use it as soon as you can. If it is almost time for your next dose, use only that dose. Do not use double or extra doses. What may interact with this medication? Interactions are not expected. Do not use any other eye products without  advice of your care team. This list may not describe all possible interactions. Give your health care provider a list of all the medicines, herbs, non-prescription drugs, or dietary supplements you use. Also tell them if you smoke, drink alcohol, or use illegal drugs. Some items may interact with your medicine. What should I watch for while using this medication? Check with your care team if your condition does not get better after 5 days, or  if it gets worse. If you wear contact lenses, ask when you can use your lenses again. A burning or stinging reaction that does not go away may mean you are allergic to this product. Stop use and call your care team. To prevent the spread of infection, do not share eye products or other personal items with anyone else. What side effects may I notice from receiving this medication? Side effects that you should report to your care team as soon as possible: Allergic reactions--skin rash, itching, hives, swelling of the face, lips, tongue, or throat New or worsening eye pain, redness, irritation, or discharge Side effects that usually do not require medical attention (report to your care team if they continue or are bothersome): Eye irritation or itching Increased tears after use This list may not describe all possible side effects. Call your doctor for medical advice about side effects. You may report side effects to FDA at 1-800-FDA-1088. Where should I keep my medication? Keep out of the reach of children. Store at room temperature 15 to 25 degrees C (59 to 77 degrees F). Protect from light. To prevent the spread of infection, it is best to throw away any unused eye drops after you finish the course of treatment. Throw away any unused medication after the expiration date. NOTE: This sheet is a summary. It may not cover all possible information. If you have questions about this medicine, talk to your doctor, pharmacist, or health care provider.  2023 Elsevier/Gold Standard (2020-06-29 00:00:00)    If you have been instructed to have an in-person evaluation today at a local Urgent Care facility, please use the link below. It will take you to a list of all of our available [facility], including address, phone number and hours of operation. Please do not delay care.  [facility]  If you or a family member do not have a primary care provider, use the link below to schedule  a visit and establish care. When you choose a [facility] primary care physician or advanced practice provider, you gain a long-term partner in health. Find a Primary Care Provider  Learn more about [facility]'s in-office and virtual care options: [facility]

## 2021-11-26 NOTE — Progress Notes (Signed)
Virtual Visit Consent   Shari Gallagher, you are scheduled for a virtual visit with a [facility] provider today. Just as with appointments in the office, your consent must be obtained to participate. Your consent will be active for this visit and any virtual visit you may have with one of our providers in the next 365 days. If you have a MyChart account, a copy of this consent can be sent to you electronically.  As this is a virtual visit, video technology does not allow for your provider to perform a traditional examination. This may limit your provider's ability to fully assess your condition. If your provider identifies any concerns that need to be evaluated in person or the need to arrange testing (such as labs, EKG, etc.), we will make arrangements to do so. Although advances in technology are sophisticated, we cannot ensure that it will always work on either your end or our end. If the connection with a video visit is poor, the visit may have to be switched to a telephone visit. With either a video or telephone visit, we are not always able to ensure that we have a secure connection.  By engaging in this virtual visit, you consent to the provision of healthcare and authorize for your insurance to be billed (if applicable) for the services provided during this visit. Depending on your insurance coverage, you may receive a charge related to this service.  I need to obtain your verbal consent now. Are you willing to proceed with your visit today? Shari Gallagher has provided verbal consent on 11/26/2021 for a virtual visit (video or telephone). Mar Daring, PA-C  Date: 11/26/2021 5:33 PM  Virtual Visit via Video Note   I, Mar Daring, connected with  Shari Gallagher  ([id number], 11-May-1985) on 11/26/21 at  5:30 PM EDT by a video-enabled telemedicine application and verified that I am speaking with the correct person using two identifiers.  Location: Patient: Virtual Visit Location  Patient: Home Provider: Virtual Visit Location Provider: Home Office   I discussed the limitations of evaluation and management by telemedicine and the availability of in person appointments. The patient expressed understanding and agreed to proceed.    History of Present Illness: Shari Gallagher is a 36 y.o. who identifies as a female who was assigned female at birth, and is being seen today for possible pink eye.  HPI: Conjunctivitis  The current episode started today. The onset was gradual. The problem occurs continuously. The problem has been gradually worsening. The problem is mild. Nothing relieves the symptoms. Nothing aggravates the symptoms. Associated symptoms include decreased vision, eye itching, eye discharge, eye pain and eye redness. Pertinent negatives include no fever, no double vision, no photophobia, no congestion, no ear pain, no headaches, no rhinorrhea, no sore throat and no URI. The eye pain is mild. The right eye is affected. The eye pain is not associated with movement. The eyelid exhibits no abnormality.   Has chronic issues with dry eye. Has been using Lumify for redness and did use restasis (her mother's) for dry eye.   Problems:  Patient Active Problem List   Diagnosis Date Noted   Menorrhagia with regular cycle 10/09/2019   Pelvic pain 10/09/2019   Recurrent UTI 12/26/2018   SYMPTOM, ABNORMALITY, URINATION NEC 12/10/2006   History of hernia repair 06/10/2006   Depression 05/12/2005    Allergies:  Allergies  Allergen Reactions   Latex Rash   Medications:  Current Outpatient Medications:  trimethoprim-polymyxin b (POLYTRIM) ophthalmic solution, Place 1 drop into the right eye every 4 (four) hours. X 5 days, Disp: 10 mL, Rfl: 0   albuterol (VENTOLIN HFA) 108 (90 Base) MCG/ACT inhaler, Inhale 2 puffs into the lungs every 6 (six) hours as needed for wheezing or shortness of breath., Disp: 8 g, Rfl: 0  Observations/Objective: Patient is well-developed,  well-nourished in no acute distress.  Resting comfortably at home.  Head is normocephalic, atraumatic.  No labored breathing.  Speech is clear and coherent with logical content.  Patient is alert and oriented at baseline.    Assessment and Plan: 1. Infection of right eye - trimethoprim-polymyxin b (POLYTRIM) ophthalmic solution; Place 1 drop into the right eye every 4 (four) hours. X 5 days  Dispense: 10 mL; Refill: 0  - Suspect bacterial infection - Polytrim prescribed - Warm compresses - Good hand hygiene - Wait 10 minutes after polytrim before using other eye drops - Seek in person evaluation if symptoms worsen or fail to improve   Follow Up Instructions: I discussed the assessment and treatment plan with the patient. The patient was provided an opportunity to ask questions and all were answered. The patient agreed with the plan and demonstrated an understanding of the instructions.  A copy of instructions were sent to the patient via MyChart unless otherwise noted below.    The patient was advised to call back or seek an in-person evaluation if the symptoms worsen or if the condition fails to improve as anticipated.  Time:  I spent 9 minutes with the patient via telehealth technology discussing the above problems/concerns.    [name], PA-C

## 2022-03-03 ENCOUNTER — Telehealth: Payer: No Typology Code available for payment source | Admitting: Nurse Practitioner

## 2022-03-03 DIAGNOSIS — J40 Bronchitis, not specified as acute or chronic: Secondary | ICD-10-CM

## 2022-03-03 MED ORDER — AZITHROMYCIN 250 MG PO TABS: ORAL_TABLET | ORAL | 0 refills | Status: AC

## 2022-03-03 MED ORDER — ALBUTEROL SULFATE HFA 108 (90 BASE) MCG/ACT IN AERS: 2.0000 | INHALATION_SPRAY | Freq: Four times a day (QID) | RESPIRATORY_TRACT | 0 refills | Status: DC | PRN

## 2022-03-03 MED ORDER — PREDNISONE 20 MG PO TABS: 20.0000 mg | ORAL_TABLET | Freq: Every day | ORAL | 0 refills | Status: AC

## 2022-03-03 MED ORDER — BENZONATATE 100 MG PO CAPS: 100.0000 mg | ORAL_CAPSULE | Freq: Three times a day (TID) | ORAL | 0 refills | Status: DC | PRN

## 2022-03-03 NOTE — Progress Notes (Signed)
We are sorry that you are not feeling well.  Here is how we plan to help!  Based on your presentation I believe you most likely have A cough due to bacteria.  When patients have a fever and a productive cough with a change in color or increased sputum production, we are concerned about bacterial bronchitis.  If left untreated it can progress to pneumonia.  If your symptoms do not improve with your treatment plan it is important that you contact your provider.   I have prescribed Azithromyin 250 mg: two tablets now and then one tablet daily for 4 additonal days    In addition you may use A prescription cough medication called Tessalon Perles 100mg . You may take 1-2 capsules every 8 hours as needed for your cough.  Prednisone 20 mg daily for 5 days  We Will also call in a refill on your inhaler to assure you have enough of that to get through the rest of your illness.     From your responses in the eVisit questionnaire you describe inflammation in the upper respiratory tract which is causing a significant cough.  This is commonly called Bronchitis and has four common causes:   Allergies Viral Infections Acid Reflux Bacterial Infection Allergies, viruses and acid reflux are treated by controlling symptoms or eliminating the cause. An example might be a cough caused by taking certain blood pressure medications. You stop the cough by changing the medication. Another example might be a cough caused by acid reflux. Controlling the reflux helps control the cough.  USE OF BRONCHODILATOR ("RESCUE") INHALERS: There is a risk from using your bronchodilator too frequently.  The risk is that over-reliance on a medication which only relaxes the muscles surrounding the breathing tubes can reduce the effectiveness of medications prescribed to reduce swelling and congestion of the tubes themselves.  Although you feel brief relief from the bronchodilator inhaler, your asthma may actually be worsening with the tubes  becoming more swollen and filled with mucus.  This can delay other crucial treatments, such as oral steroid medications. If you need to use a bronchodilator inhaler daily, several times per day, you should discuss this with your provider.  There are probably better treatments that could be used to keep your asthma under control.     HOME CARE Only take medications as instructed by your medical team. Complete the entire course of an antibiotic. Drink plenty of fluids and get plenty of rest. Avoid close contacts especially the very young and the elderly Cover your mouth if you cough or cough into your sleeve. Always remember to wash your hands A steam or ultrasonic humidifier can help congestion.   GET HELP RIGHT AWAY IF: You develop worsening fever. You become short of breath You cough up blood. Your symptoms persist after you have completed your treatment plan MAKE SURE YOU  Understand these instructions. Will watch your condition. Will get help right away if you are not doing well or get worse.    Thank you for choosing an e-visit.  Your e-visit answers were reviewed by a board certified advanced clinical practitioner to complete your personal care plan. Depending upon the condition, your plan could have included both over the counter or prescription medications.  Please review your pharmacy choice. Make sure the pharmacy is open so you can pick up prescription now. If there is a problem, you may contact your provider through and have the prescription routed to another pharmacy.  Your safety is  important to [place]. If you have drug allergies check your prescription carefully.   For the next 24 hours you can use MyChart to ask questions about today's visit, request a non-urgent call back, or ask for a work or school excuse. You will get an email in the next two days asking about your experience. I hope that your e-visit has been valuable and will speed your recovery.    Meds ordered this encounter  Medications   albuterol (VENTOLIN HFA) 108 (90 Base) MCG/ACT inhaler    Sig: Inhale 2 puffs into the lungs every 6 (six) hours as needed for wheezing or shortness of breath.    Dispense:  8 g    Refill:  0   benzonatate (TESSALON) 100 MG capsule    Sig: Take 1 capsule (100 mg total) by mouth 3 (three) times daily as needed.    Dispense:  30 capsule    Refill:  0   azithromycin (ZITHROMAX) 250 MG tablet    Sig: Take 2 tablets on day 1, then 1 tablet daily on days 2 through 5    Dispense:  6 tablet    Refill:  0   predniSONE (DELTASONE) 20 MG tablet    Sig: Take 1 tablet (20 mg total) by mouth daily with breakfast for 5 days.    Dispense:  5 tablet    Refill:  0     I spent approximately 5 minutes reviewing the patient's history, current symptoms and coordinating their care today.

## 2022-04-21 DIAGNOSIS — N6489 Other specified disorders of breast: Secondary | ICD-10-CM | POA: Diagnosis not present

## 2022-04-21 DIAGNOSIS — Z6835 Body mass index (BMI) 35.0-35.9, adult: Secondary | ICD-10-CM | POA: Diagnosis not present

## 2022-04-21 DIAGNOSIS — Z01411 Encounter for gynecological examination (general) (routine) with abnormal findings: Secondary | ICD-10-CM | POA: Diagnosis not present

## 2022-04-21 DIAGNOSIS — Z124 Encounter for screening for malignant neoplasm of cervix: Secondary | ICD-10-CM | POA: Diagnosis not present

## 2022-04-21 DIAGNOSIS — Z3040 Encounter for surveillance of contraceptives, unspecified: Secondary | ICD-10-CM | POA: Diagnosis not present

## 2022-04-21 DIAGNOSIS — A609 Anogenital herpesviral infection, unspecified: Secondary | ICD-10-CM | POA: Diagnosis not present

## 2022-04-21 DIAGNOSIS — Z113 Encounter for screening for infections with a predominantly sexual mode of transmission: Secondary | ICD-10-CM | POA: Diagnosis not present

## 2022-04-24 LAB — HM PAP SMEAR: HPV, high-risk: NEGATIVE

## 2022-05-06 ENCOUNTER — Telehealth: Payer: 59 | Admitting: Nurse Practitioner

## 2022-05-06 DIAGNOSIS — H9209 Otalgia, unspecified ear: Secondary | ICD-10-CM

## 2022-05-06 NOTE — Progress Notes (Signed)
Based on what you shared with me it looks like you have ear pain with discharge,that should be evaluated in a face to face office visit. Tasia Catchings need someone  to look in side your ear to see what is going on in there.  NOTE: There will be NO CHARGE for this eVisit   If you are having a true medical emergency please call 911.      For an urgent face to face visit, [facility] has six urgent care centers for your convenience:     [facility] [place] Get Driving Directions [id number] [place], [place] [address]    [facility]) Get Driving Directions [id number] [place], [place] [address]  [facility] ([place]) Get Driving Directions [id number] [address] [place],  [place]  [address]  [facility] Get Driving Directions [id number] [place], [place], [place] [address]   [facility] Get Driving Directions  [id number] [address] [place], [place] [address]   [facility] [place] Get Driving Directions [id number] [address], [place],  [address]  Your MyChart E-visit questionnaire answers were reviewed by a board certified advanced clinical practitioner to complete your personal care plan based on your specific symptoms.  Thank you for using e-Visits.

## 2022-05-07 ENCOUNTER — Ambulatory Visit
Admission: EM | Admit: 2022-05-07 | Discharge: 2022-05-07 | Disposition: A | Discharge status: Home / Self Care | Payer: 59 | Attending: Internal Medicine | Admitting: Internal Medicine

## 2022-05-07 DIAGNOSIS — J029 Acute pharyngitis, unspecified: Secondary | ICD-10-CM | POA: Insufficient documentation

## 2022-05-07 DIAGNOSIS — H6593 Unspecified nonsuppurative otitis media, bilateral: Secondary | ICD-10-CM | POA: Diagnosis not present

## 2022-05-07 LAB — POCT RAPID STREP A (OFFICE): Rapid Strep A Screen: NEGATIVE

## 2022-05-07 MED ORDER — CETIRIZINE HCL 10 MG PO TABS: 10.0000 mg | ORAL_TABLET | Freq: Every day | ORAL | 0 refills | Status: AC

## 2022-05-07 NOTE — Discharge Instructions (Signed)
Rapid strep is negative.  Throat culture is pending.  Will call if it is positive.  It appears that you have fluid behind your eardrums.  I am treating this with cetirizine antihistamine.  Also recommend that you use your Flonase daily at home in addition.  Follow-up if any symptoms persist or worsen.

## 2022-05-07 NOTE — ED Triage Notes (Signed)
Patient with c/o right ear pain that started 2 days ago. States she tried peroxide and lidocaine drops. States the pain is now going down into her neck.

## 2022-05-07 NOTE — ED Provider Notes (Signed)
[facility]    CSN: [id number] Arrival date & time: 05/07/22  [id number]      History   Chief Complaint Chief Complaint  Patient presents with   Ear Pain    HPI Shari Gallagher is a 37 y.o. female.   Patient presents with right ear pain that started 2 days ago.  States it now feels like the ear pain is radiating down her throat and she has a little bit of a sore throat.  Reports very mild nasal congestion but no cough or fever.  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  Patient used hydrogen peroxide and lidocaine eardrops with no improvement in pain.  No trauma or foreign body to the ear.  Denies any drainage from the ear.     Past Medical History:  Diagnosis Date   Depression    Urinary tract infection     Patient Active Problem List   Diagnosis Date Noted   Menorrhagia with regular cycle 10/09/2019   Pelvic pain 10/09/2019   Recurrent UTI 12/26/2018   SYMPTOM, ABNORMALITY, URINATION NEC 12/10/2006   History of hernia repair 06/10/2006   Depression 05/12/2005    Past Surgical History:  Procedure Laterality Date   HERNIA REPAIR  1995    OB History   No obstetric history on file.      Home Medications    Prior to Admission medications   Medication Sig Start Date End Date Taking? Authorizing Provider  cetirizine (ZYRTEC) 10 MG tablet Take 1 tablet (10 mg total) by mouth daily. 05/07/22  Yes Kanesha Cadle, Michele Rockers, FNP    Family History Family History  Problem Relation Age of Onset   Depression Mother    Hypertension Mother    Depression Father    Diabetes Father    Hyperlipidemia Father    Heart disease Maternal Grandfather    Breast cancer Paternal Grandmother    Lung cancer Paternal Grandfather    Heart disease Paternal Grandfather     Social History Social History   Tobacco Use   Smoking status: Every Day    Packs/day: 1.00    Types: Cigarettes   Smokeless tobacco: Never  Substance Use Topics   Alcohol use: No      Allergies   Latex and Mixed ragweed   Review of Systems Review of Systems Per HPI  Physical Exam Triage Vital Signs ED Triage Vitals [05/07/22 0830]  Enc Vitals Group     BP 136/85     Pulse Rate 75     Resp      Temp 98.4 F (36.9 C)     Temp Source Oral     SpO2 95 %     Weight      Height      Head Circumference      Peak Flow      Pain Score 7     Pain Loc      Pain Edu?      Excl. in [place]?    No data found.  Updated Vital Signs BP 136/85   Pulse 75   Temp 98.4 F (36.9 C) (Oral)   LMP 05/04/2022 (Exact Date)   SpO2 95%   Visual Acuity Right Eye Distance:   Left Eye Distance:   Bilateral Distance:    Right Eye Near:   Left Eye Near:    Bilateral Near:     Physical Exam Constitutional:      General: She is  not in acute distress.    Appearance: Normal appearance. She is not toxic-appearing or diaphoretic.  HENT:     Head: Normocephalic and atraumatic.     Right Ear: Ear canal normal. No drainage, swelling or tenderness. A middle ear effusion is present. Tympanic membrane is not perforated, erythematous or bulging.     Left Ear: Ear canal normal. No drainage, swelling or tenderness. A middle ear effusion is present. Tympanic membrane is not perforated, erythematous or bulging.     Nose: Congestion present.     Mouth/Throat:     Mouth: Mucous membranes are moist.     Pharynx: Posterior oropharyngeal erythema present.  Eyes:     Extraocular Movements: Extraocular movements intact.     Conjunctiva/sclera: Conjunctivae normal.     Pupils: Pupils are equal, round, and reactive to light.  Cardiovascular:     Rate and Rhythm: Normal rate and regular rhythm.     Pulses: Normal pulses.     Heart sounds: Normal heart sounds.  Pulmonary:     Effort: Pulmonary effort is normal. No respiratory distress.     Breath sounds: Normal breath sounds. No stridor. No wheezing, rhonchi or rales.  Abdominal:     General: Abdomen is flat. Bowel sounds are normal.      Palpations: Abdomen is soft.  Musculoskeletal:        General: Normal range of motion.     Cervical back: Normal range of motion.  Lymphadenopathy:     Cervical: No cervical adenopathy.  Skin:    General: Skin is warm and dry.  Neurological:     General: No focal deficit present.     Mental Status: She is alert and oriented to person, place, and time. Mental status is at baseline.  Psychiatric:        Mood and Affect: Mood normal.        Behavior: Behavior normal.      UC Treatments / Results  Labs (all labs ordered are listed, but only abnormal results are displayed) Labs Reviewed  CULTURE, GROUP A STREP [facility])  POCT RAPID STREP A (OFFICE)    EKG   Radiology No results found.  Procedures Procedures (including critical care time)  Medications Ordered in UC Medications - No data to display  Initial Impression / Assessment and Plan / UC Course  I have reviewed the triage vital signs and the nursing notes.  Pertinent labs & imaging results that were available during my care of the patient were reviewed by me and considered in my medical decision making (see chart for details).     Patient has fluid behind TMs which is likely causing pain and discomfort.  Will treat with antihistamine and Flonase.  Patient states that she already has Flonase at home.  Encouraged her to use this in addition to the antihistamine.  She denies that she takes any daily medications other than vitamins so this should be safe.  Given associated sore throat, strep test was completed that was negative.  Throat culture is pending.  Patient could have allergic rhinitis versus viral illness causing additional symptoms.  Discussed supportive care and symptom management with patient.  Discussed return precautions.  Patient verbalized understanding and was agreeable with plan. Final Clinical Impressions(s) / UC Diagnoses   Final diagnoses:  Fluid level behind tympanic membrane of both ears  Sore  throat     Discharge Instructions      Rapid strep is negative.  Throat culture is pending.  Will  call if it is positive.  It appears that you have fluid behind your eardrums.  I am treating this with cetirizine antihistamine.  Also recommend that you use your Flonase daily at home in addition.  Follow-up if any symptoms persist or worsen.     ED Prescriptions     Medication Sig Dispense Auth. Provider   cetirizine (ZYRTEC) 10 MG tablet Take 1 tablet (10 mg total) by mouth daily. 30 tablet [place], Michele Rockers, [place]      PDMP not reviewed this encounter.   Teodora Medici, [place] 05/07/22 [phone]

## 2022-05-08 LAB — CULTURE, GROUP A STREP (THRC)

## 2022-05-09 LAB — CULTURE, GROUP A STREP (THRC)

## 2022-05-11 ENCOUNTER — Encounter: Payer: Self-pay | Admitting: Primary Care

## 2022-05-11 ENCOUNTER — Ambulatory Visit (INDEPENDENT_AMBULATORY_CARE_PROVIDER_SITE_OTHER): Payer: 59 | Admitting: Primary Care

## 2022-05-11 VITALS — BP 104/68 | HR 76 | Temp 98.6°F | Ht 68.0 in | Wt 236.0 lb

## 2022-05-11 DIAGNOSIS — F3342 Major depressive disorder, recurrent, in full remission: Secondary | ICD-10-CM

## 2022-05-11 DIAGNOSIS — H65193 Other acute nonsuppurative otitis media, bilateral: Secondary | ICD-10-CM

## 2022-05-11 DIAGNOSIS — N920 Excessive and frequent menstruation with regular cycle: Secondary | ICD-10-CM

## 2022-05-11 DIAGNOSIS — Z0000 Encounter for general adult medical examination without abnormal findings: Secondary | ICD-10-CM | POA: Diagnosis not present

## 2022-05-11 DIAGNOSIS — H65199 Other acute nonsuppurative otitis media, unspecified ear: Secondary | ICD-10-CM | POA: Insufficient documentation

## 2022-05-11 LAB — COMPREHENSIVE METABOLIC PANEL
ALT: 16 U/L (ref 0–35)
AST: 15 U/L (ref 0–37)
Albumin: 4.1 g/dL (ref 3.5–5.2)
Alkaline Phosphatase: 82 U/L (ref 39–117)
BUN: 13 mg/dL (ref 6–23)
CO2: 24 mEq/L (ref 19–32)
Calcium: 9.4 mg/dL (ref 8.4–10.5)
Chloride: 104 mEq/L (ref 96–112)
Creatinine, Ser: 0.71 mg/dL (ref 0.40–1.20)
GFR: 109.92 mL/min (ref >60.00)
Glucose, Bld: 87 mg/dL (ref 70–99)
Potassium: 4.2 mEq/L (ref 3.5–5.1)
Sodium: 137 mEq/L (ref 135–145)
Total Bilirubin: 0.4 mg/dL (ref 0.2–1.2)
Total Protein: 6.8 g/dL (ref 6.0–8.3)

## 2022-05-11 LAB — CBC
HCT: 43.3 % (ref 36.0–46.0)
Hemoglobin: 14.5 g/dL (ref 12.0–15.0)
MCHC: 33.4 g/dL (ref 30.0–36.0)
MCV: 95.1 fl (ref 78.0–100.0)
Platelets: 295 10*3/uL (ref 150.0–400.0)
RBC: 4.55 Mil/uL (ref 3.87–5.11)
RDW: 14 % (ref 11.5–15.5)
WBC: 9 10*3/uL (ref 4.0–10.5)

## 2022-05-11 LAB — LIPID PANEL
Cholesterol: 227 mg/dL — ABNORMAL HIGH (ref 0–200)
HDL: 49.2 mg/dL (ref >39.00)
LDL Cholesterol: 143 mg/dL — ABNORMAL HIGH (ref 0–99)
NonHDL: 177.37
Total CHOL/HDL Ratio: 5
Triglycerides: 172 mg/dL — ABNORMAL HIGH (ref 0.0–149.0)
VLDL: 34.4 mg/dL (ref 0.0–40.0)

## 2022-05-11 LAB — HEMOGLOBIN A1C: Hgb A1c MFr Bld: 5.5 % (ref 4.6–6.5)

## 2022-05-11 LAB — TSH: TSH: 1.28 u[IU]/mL (ref 0.35–5.50)

## 2022-05-11 NOTE — Assessment & Plan Note (Signed)
Denies concerns today. °Continue to monitor. °

## 2022-05-11 NOTE — Assessment & Plan Note (Signed)
Overall controlled and improved. Follows with GYN.   Continue to monitor.

## 2022-05-11 NOTE — Assessment & Plan Note (Signed)
Without infection.  Discussed continued use of Zyrtec and Flonase daily. Follow up PRN.

## 2022-05-11 NOTE — Assessment & Plan Note (Signed)
Immunizations UTD. Pap smear UTD. Follows with GYN.  Discussed the importance of a healthy diet and regular exercise in order for weight loss, and to reduce the risk of further co-morbidity.  Exam stable. Labs pending.  Follow up in 1 year for repeat physical.

## 2022-05-11 NOTE — Progress Notes (Signed)
Subjective:    Patient ID: Shari Gallagher, female    DOB: 1985-09-24, 37 y.o.   MRN: [id number]  HPI  SHAKISHA HERRAN is a very pleasant 37 y.o. female who presents today for complete physical and follow up of chronic conditions.  She has not been seen since 2021.  She would also like to discuss ear fullness/drainage. Evaluated at Urgent care on 05/07/22 for a two day history of right ear pain, sore throat, mild nasal congestion. During this visit she was noted to have fluid behind her TM's and was treated with daily antihistamine and Flonase.   Today she endorses compliance to Zyrtec and Flonase. She has pain to the left cervical neck. Also neck pain with movement.   Immunizations: -Tetanus: Completed in 2015 -Influenza: Completed this season  Diet: [place].  Exercise: No regular exercise.  Eye exam: Completed several years ago.  Dental exam: Completes semi-annually    Pap Smear: Follows with GYN.   BP Readings from Last 3 Encounters:  05/11/22 104/68  05/07/22 136/85  10/09/19 116/78     Review of Systems  Constitutional:  Negative for unexpected weight change.  HENT:  Positive for ear pain and postnasal drip. Negative for congestion and sore throat.   Respiratory:  Negative for cough and shortness of breath.   Cardiovascular:  Negative for chest pain.  Gastrointestinal:  Negative for constipation and diarrhea.  Genitourinary:  Negative for difficulty urinating and menstrual problem.  Musculoskeletal:  Negative for arthralgias and myalgias.  Skin:  Negative for rash.  Allergic/Immunologic: Positive for environmental allergies.  Neurological:  Negative for dizziness, numbness and headaches.  Psychiatric/Behavioral:  The patient is not nervous/anxious.          Past Medical History:  Diagnosis Date   Depression    History of hernia repair 06/10/2006   Qualifier: Diagnosis of   By: Maxie Better FNP, Rosalita Levan      Recurrent UTI 12/26/2018   Urinary tract  infection     Social History   Socioeconomic History   Marital status: Single    Spouse name: Not on file   Number of children: Not on file   Years of education: Not on file   Highest education level: Not on file  Occupational History   Not on file  Tobacco Use   Smoking status: Every Day    Packs/day: 1.00    Types: Cigarettes   Smokeless tobacco: Never  Substance and Sexual Activity   Alcohol use: No   Drug use: Not on file   Sexual activity: Never    Birth control/protection: None  Other Topics Concern   Not on file  Social History Narrative   Not on file   Social Determinants of Health   Financial Resource Strain: Not on file  Food Insecurity: Not on file  Transportation Needs: Not on file  Physical Activity: Not on file  Stress: Not on file  Social Connections: Not on file  Intimate Partner Violence: Not on file    Past Surgical History:  Procedure Laterality Date   [place]    Family History  Problem Relation Age of Onset   Depression Mother    Hypertension Mother    Depression Father    Diabetes Father    Hyperlipidemia Father    Heart disease Maternal Grandfather    Breast cancer Paternal Grandmother    Lung cancer Paternal Grandfather    Heart disease Paternal Grandfather     Allergies  Allergen Reactions   Latex Rash   Mixed Ragweed Cough    Current Outpatient Medications on File Prior to Visit  Medication Sig Dispense Refill   cetirizine (ZYRTEC) 10 MG tablet Take 1 tablet (10 mg total) by mouth daily. 30 tablet 0   No current facility-administered medications on file prior to visit.    BP 104/68   Pulse 76   Temp 98.6 F (37 C) (Temporal)   Ht '5\' 8"'$  (1.727 m)   Wt 236 lb (107 kg)   LMP 05/04/2022 (Exact Date)   SpO2 100%   BMI 35.88 kg/m  Objective:   Physical Exam HENT:     Right Ear: Tympanic membrane and ear canal normal.     Left Ear: Tympanic membrane and ear canal normal.     Nose: Nose normal.  Eyes:      Conjunctiva/sclera: Conjunctivae normal.     Pupils: Pupils are equal, round, and reactive to light.  Neck:     Thyroid: No thyromegaly.  Cardiovascular:     Rate and Rhythm: Normal rate and regular rhythm.     Heart sounds: No murmur heard. Pulmonary:     Effort: Pulmonary effort is normal.     Breath sounds: Normal breath sounds. No rales.  Abdominal:     General: Bowel sounds are normal.     Palpations: Abdomen is soft.     Tenderness: There is no abdominal tenderness.  Musculoskeletal:        General: Normal range of motion.     Cervical back: Neck supple.  Lymphadenopathy:     Cervical: No cervical adenopathy.  Skin:    General: Skin is warm and dry.     Findings: No rash.  Neurological:     Mental Status: She is alert and oriented to person, place, and time.     Cranial Nerves: No cranial nerve deficit.     Deep Tendon Reflexes: Reflexes are normal and symmetric.  Psychiatric:        Mood and Affect: Mood normal.           Assessment & Plan:  Preventative health care Assessment & Plan: Immunizations UTD. Pap smear UTD. Follows with GYN.  Discussed the importance of a healthy diet and regular exercise in order for weight loss, and to reduce the risk of further co-morbidity.  Exam stable. Labs pending.  Follow up in 1 year for repeat physical.   Orders: -     Lipid panel -     Hemoglobin A1c -     Comprehensive metabolic panel -     CBC -     TSH  Recurrent major depressive disorder, in full remission [facility]) Assessment & Plan: Denies concerns today.  Continue to monitor.    Menorrhagia with regular cycle Assessment & Plan: Overall controlled and improved. Follows with GYN.   Continue to monitor.    Acute effusion of both middle ears Assessment & Plan: Without infection.  Discussed continued use of Zyrtec and Flonase daily. Follow up PRN.         Pleas Koch, NP

## 2022-05-11 NOTE — Patient Instructions (Signed)
Stop by the lab prior to leaving today. I will notify you of your results once received.   Continue Zyrtec and Flonase.  It was a pleasure to see you today!

## 2022-06-27 IMAGING — US US PELVIS COMPLETE WITH TRANSVAGINAL
1 series · 13 of 25 positions shown · non-contrast
Comparison: 06/26/2006

CLINICAL DATA: Pelvic pain, menorrhagia with regular cycle for 1
month, LMP 09/20/2019



[Series 1: us pelvis complete with transvaginal · 0.30mm/px · 13 of 60 slices shown]
[im 1/60]
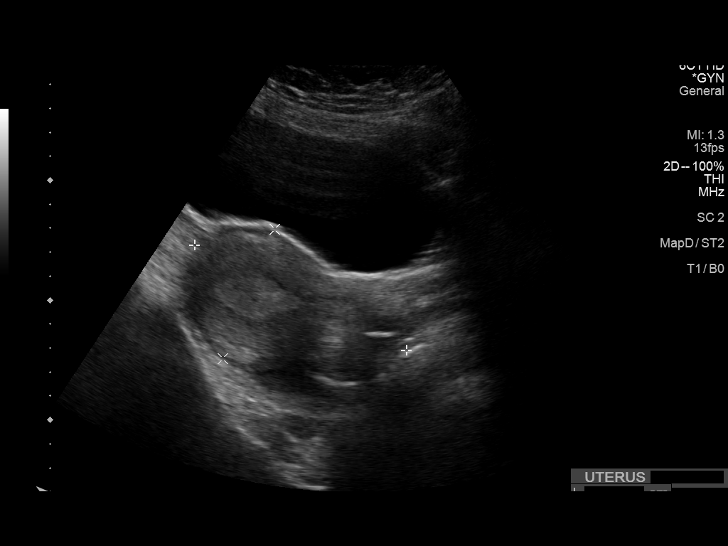
[im 5/60]
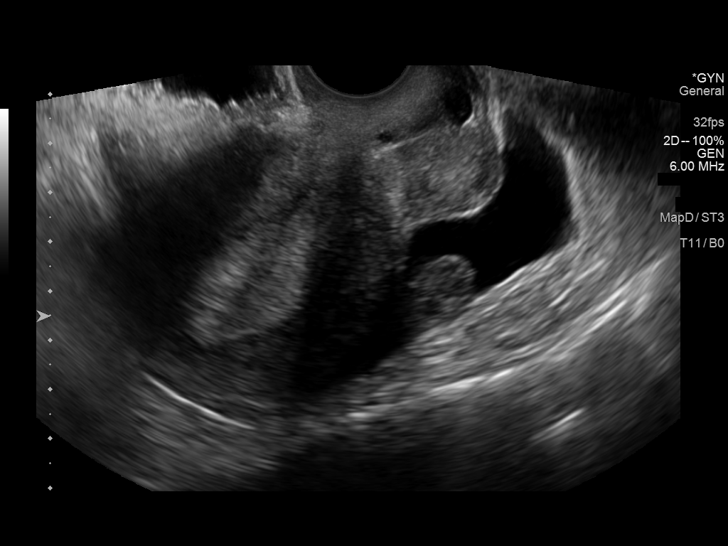
[im 10/60]
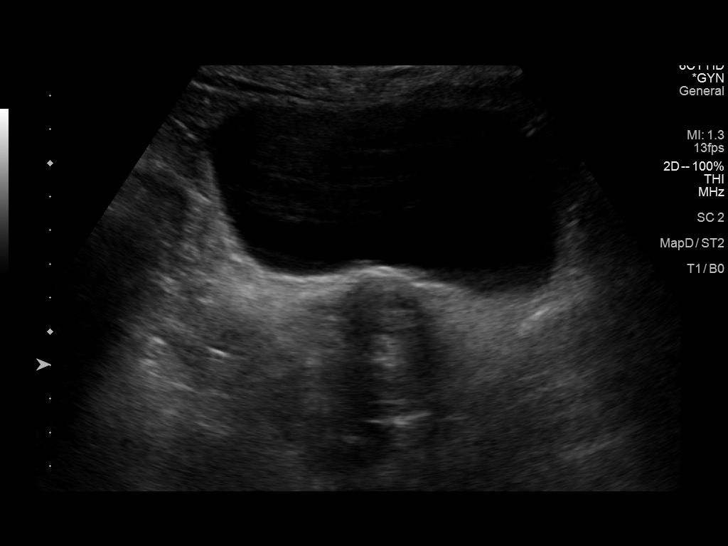
[im 15/60]
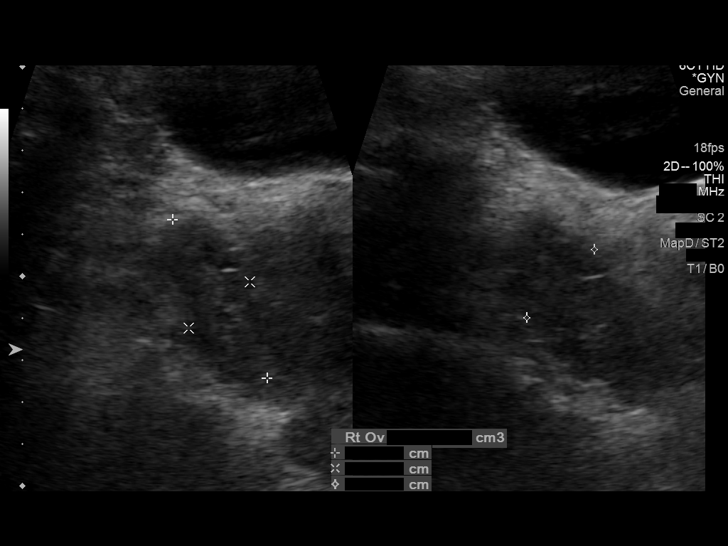
[im 20/60]
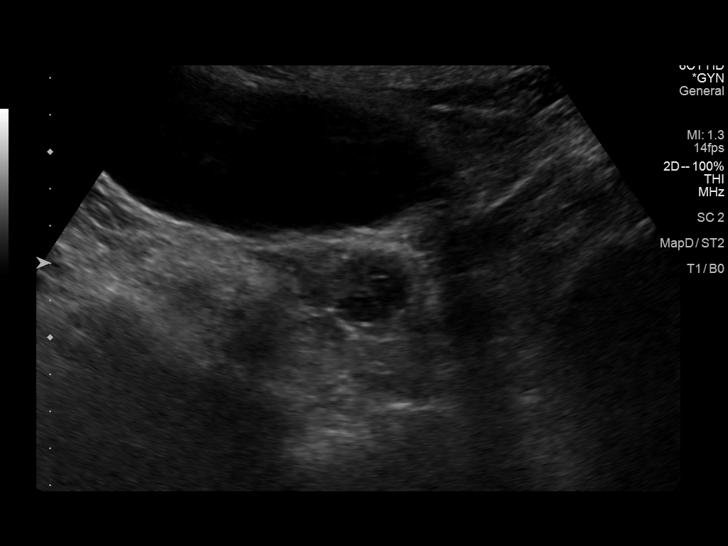
[im 25/60]
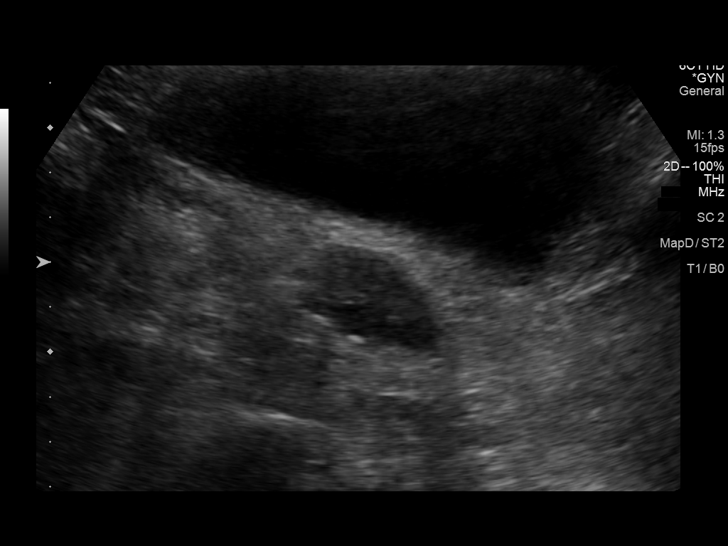
[im 30/60]
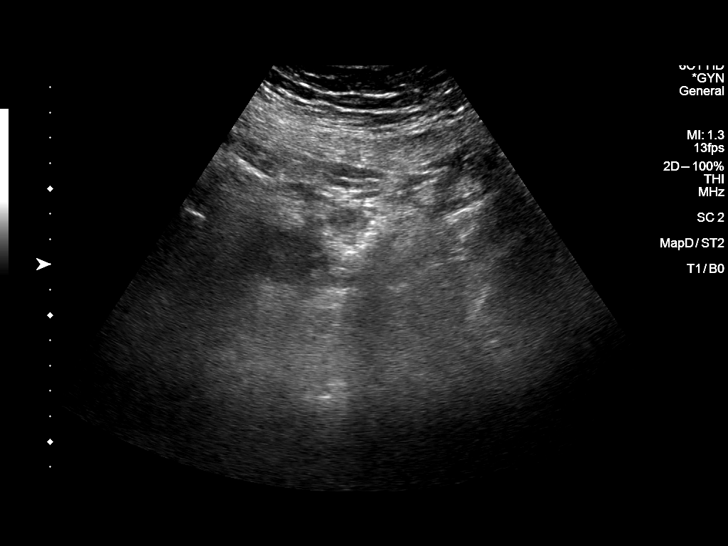
[im 35/60]
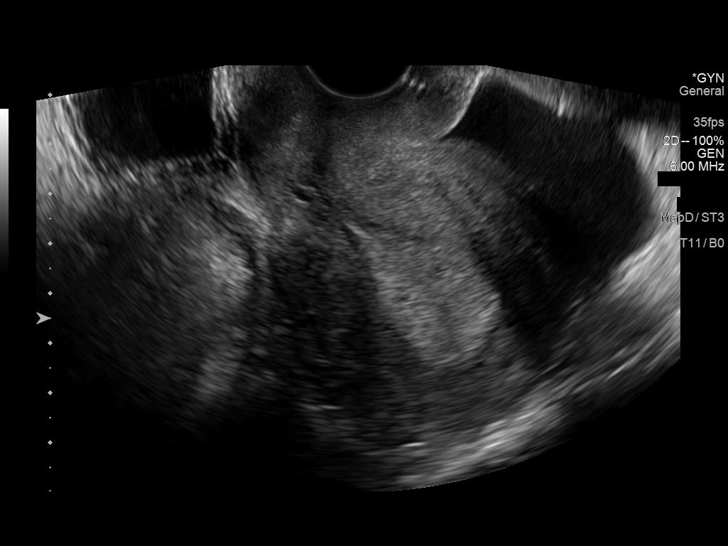
[im 40/60]
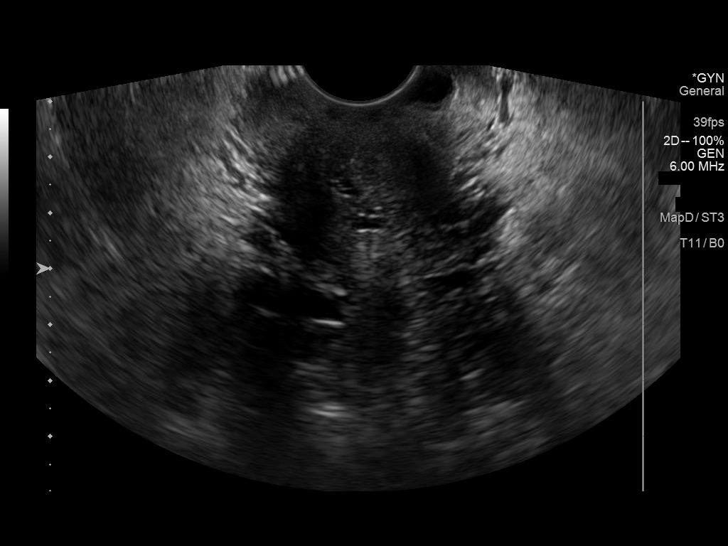
[im 45/60]
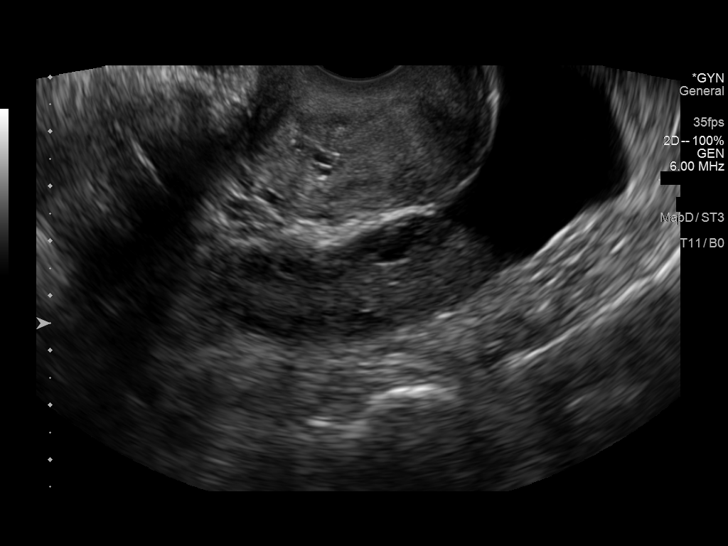
[im 50/60]
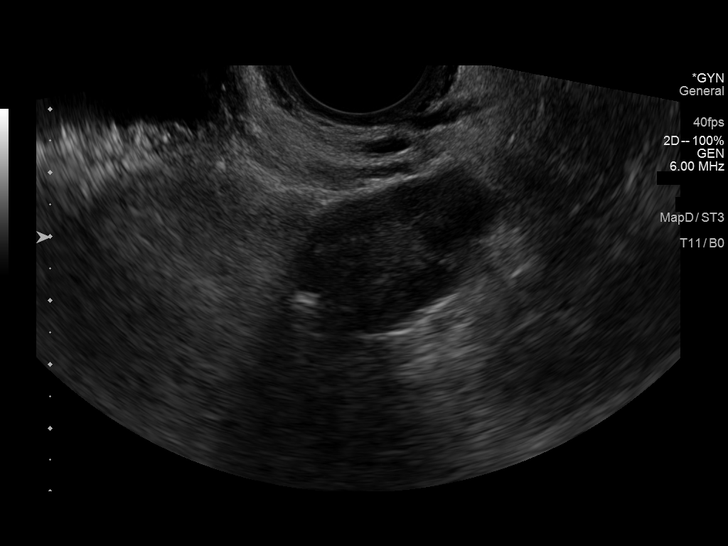
[im 55/60]
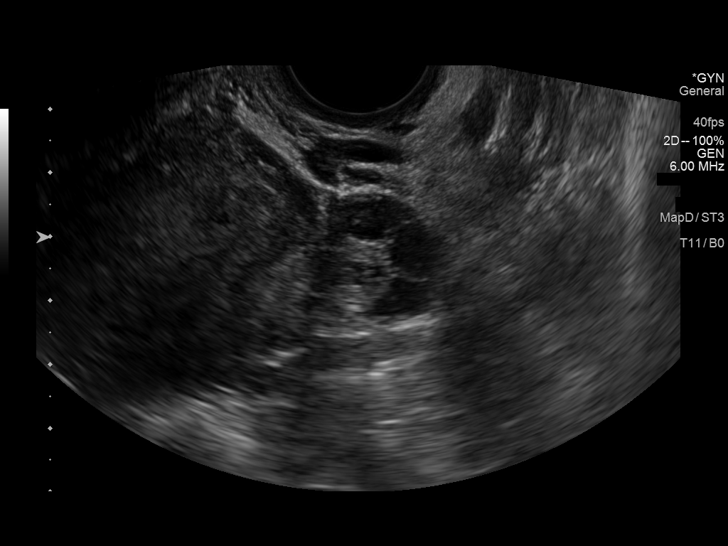
[im 60/60]
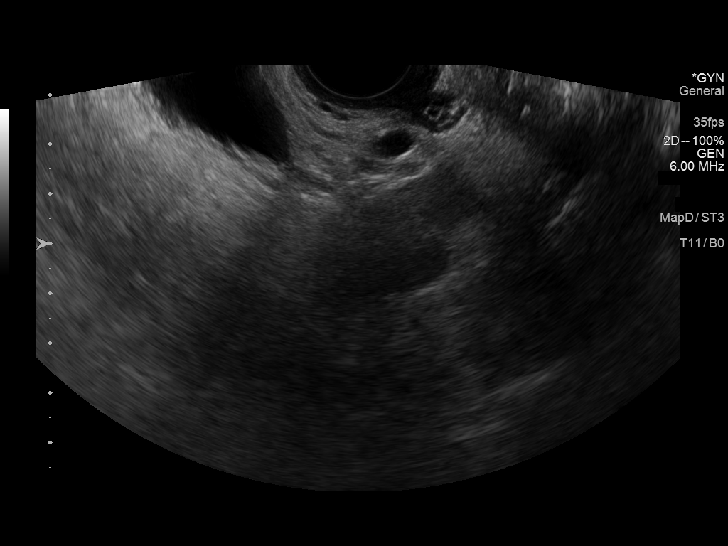

[13 of 25 positions shown; findings below may reference images not displayed]

FINDINGS: Uterus

Measurements: 9.8 x 5.8 x 6.1 cm = volume: 184 mL. Retroverted on
transvaginal imaging. No focal mass.

Endometrium

Thickness: 20 mm.  No endometrial fluid or focal abnormality

Right ovary

Measurements: At 2.9 x 1.8 x 4.0 cm = volume: 10.6 mL. Normal
morphology without mass

Left ovary

Measurements: 1.9 x 2.2 x 2.9 cm = volume: 6.3 mL. Normal morphology
without mass

Other findings

Small amount of nonspecific simple appearing free pelvic fluid. No
adnexal masses.
IMPRESSION: Small amount of nonspecific simple appearing free pelvic fluid.

Thickened endometrial complex 20 mm thick; if bleeding remains
unresponsive to hormonal or medical therapy, focal lesion work-up
with sonohysterogram should be considered. Endometrial biopsy should
also be considered in pre-menopausal patients at high risk for
endometrial carcinoma. (Ref: Radiological Reasoning: Algorithmic
Workup of Abnormal Vaginal Bleeding with Endovaginal Sonography and
Sonohysterography. AJR 2116; 191:[id number])

## 2022-11-16 ENCOUNTER — Telehealth: Payer: 59 | Admitting: Physician Assistant

## 2022-11-16 DIAGNOSIS — H44001 Unspecified purulent endophthalmitis, right eye: Secondary | ICD-10-CM | POA: Diagnosis not present

## 2022-11-16 MED ORDER — POLYMYXIN B-TRIMETHOPRIM 10000-0.1 UNIT/ML-% OP SOLN: 1.0000 [drp] | OPHTHALMIC | Status: DC

## 2022-11-16 NOTE — Progress Notes (Signed)
Virtual Visit Consent   Shari Gallagher, you are scheduled for a virtual visit with a [facility] provider today. Just as with appointments in the office, your consent must be obtained to participate. Your consent will be active for this visit and any virtual visit you may have with one of our providers in the next 365 days. If you have a MyChart account, a copy of this consent can be sent to you electronically.  As this is a virtual visit, video technology does not allow for your provider to perform a traditional examination. This may limit your provider's ability to fully assess your condition. If your provider identifies any concerns that need to be evaluated in person or the need to arrange testing (such as labs, EKG, etc.), we will make arrangements to do so. Although advances in technology are sophisticated, we cannot ensure that it will always work on either your end or our end. If the connection with a video visit is poor, the visit may have to be switched to a telephone visit. With either a video or telephone visit, we are not always able to ensure that we have a secure connection.  By engaging in this virtual visit, you consent to the provision of healthcare and authorize for your insurance to be billed (if applicable) for the services provided during this visit. Depending on your insurance coverage, you may receive a charge related to this service.  I need to obtain your verbal consent now. Are you willing to proceed with your visit today? Shari Gallagher has provided verbal consent on 11/16/2022 for a virtual visit (video or telephone). [name], PA-C  Date: 11/16/2022 7:16 PM  Virtual Visit via Video Note   I, [name], connected with  Shari Gallagher  ([id number], 1985/09/03) on 11/16/22 at  7:15 PM EDT by a video-enabled telemedicine application and verified that I am speaking with the correct person using two identifiers.  Location: Patient: Virtual Visit Location  Patient: Home Provider: Virtual Visit Location Provider: Home Office   I discussed the limitations of evaluation and management by telemedicine and the availability of in person appointments. The patient expressed understanding and agreed to proceed.    History of Present Illness: Shari Gallagher is a 37 y.o. who identifies as a female who was assigned female at birth, and is being seen today for possible conjunctivitis.  HPI: Eye Problem  The right eye is affected. This is a new (had similar instance in Sept 2023, cleared with polytrim eye drops) problem. The current episode started yesterday. The problem occurs constantly. The problem has been gradually worsening. There was no injury mechanism. The pain is mild. There is No known exposure to pink eye. She Wears contacts. Associated symptoms include blurred vision (mild), eye redness and photophobia (mild). Pertinent negatives include no double vision, fever, foreign body sensation or itching. The treatment provided no relief.      Problems:  Patient Active Problem List   Diagnosis Date Noted   Acute MEE (middle ear effusion) 05/11/2022   Preventative health care 05/11/2022   Menorrhagia with regular cycle 10/09/2019   Pelvic pain 10/09/2019   SYMPTOM, ABNORMALITY, URINATION NEC 12/10/2006   Depression 05/12/2005    Allergies:  Allergies  Allergen Reactions   Latex Rash   Mixed Ragweed Cough   Medications:  Current Outpatient Medications:    trimethoprim-polymyxin b (POLYTRIM) ophthalmic solution, Place 1 drop into the right eye every 4 (four) hours. For 5 days, Disp: ,  Rfl:    cetirizine (ZYRTEC) 10 MG tablet, Take 1 tablet (10 mg total) by mouth daily., Disp: 30 tablet, Rfl: 0  Observations/Objective: Patient is well-developed, well-nourished in no acute distress.  Resting comfortably at home.  Head is normocephalic, atraumatic.  No labored breathing.  Speech is clear and coherent with logical content.  Patient is alert and  oriented at baseline.  Right eye is slightly injected  Assessment and Plan: 1. Eye infection, right - trimethoprim-polymyxin b (POLYTRIM) ophthalmic solution; Place 1 drop into the right eye every 4 (four) hours. For 5 days  - Suspect bacterial conjunctivitis - Polytrim prescribed - Warm compresses - Good hand hygiene - Avoid contacts until treatment completed - Use new pair contacts when able to start using again - Seek in person evaluation if symptoms worsen or fail to improve'  Follow Up Instructions: I discussed the assessment and treatment plan with the patient. The patient was provided an opportunity to ask questions and all were answered. The patient agreed with the plan and demonstrated an understanding of the instructions.  A copy of instructions were sent to the patient via MyChart unless otherwise noted below.    The patient was advised to call back or seek an in-person evaluation if the symptoms worsen or if the condition fails to improve as anticipated.  Time:  I spent 10 minutes with the patient via telehealth technology discussing the above problems/concerns.    [name], PA-C

## 2022-11-16 NOTE — Patient Instructions (Signed)
Shari Gallagher, thank you for joining [name], PA-C for today's virtual visit.  While this provider is not your primary care provider (PCP), if your PCP is located in our provider database this encounter information will be shared with them immediately following your visit.   A [place] MyChart account gives you access to today's visit and all your visits, tests, and labs performed at [facility] " click here if you don't have a [facility] MyChart account or go to [email or web address]  Consent: (Patient) Shari Gallagher provided verbal consent for this virtual visit at the beginning of the encounter.  Current Medications:  Current Outpatient Medications:    trimethoprim-polymyxin b (POLYTRIM) ophthalmic solution, Place 1 drop into the right eye every 4 (four) hours. For 5 days, Disp: , Rfl:    cetirizine (ZYRTEC) 10 MG tablet, Take 1 tablet (10 mg total) by mouth daily., Disp: 30 tablet, Rfl: 0   Medications ordered in this encounter:  Meds ordered this encounter  Medications   trimethoprim-polymyxin b (POLYTRIM) ophthalmic solution    Sig: Place 1 drop into the right eye every 4 (four) hours. For 5 days    Order Specific Question:   Supervising Provider    Answer:   Merrilee Jansky [[id number]]     *If you need refills on other medications prior to your next appointment, please contact your pharmacy*  Follow-Up: Call back or seek an in-person evaluation if the symptoms worsen or if the condition fails to improve as anticipated.  [facility] [phone]  Other Instructions Bacterial Conjunctivitis, Adult Bacterial conjunctivitis is an infection of the clear membrane that covers the white part of the eye and the inner surface of the eyelid (conjunctiva). When the blood vessels in the conjunctiva become inflamed, the eye becomes red or pink. The eye often feels irritated or itchy. Bacterial conjunctivitis spreads easily from person to  person (is contagious). It also spreads easily from one eye to the other eye. What are the causes? This condition is caused by bacteria. You may get the infection if you come into close contact with: A person who is infected with the bacteria. Items that are contaminated with the bacteria, such as a face towel, contact lens solution, or eye makeup. What increases the risk? You are more likely to develop this condition if: You are exposed to other people who have the infection. You wear contact lenses. You have a sinus infection. You have had a recent eye injury or surgery. You have a weak body defense system (immune system). You have a medical condition that causes dry eyes. What are the signs or symptoms? Symptoms of this condition include: Thick, yellowish discharge from the eye. This may turn into a crust on the eyelid overnight and cause your eyelids to stick together. Tearing or watery eyes. Itchy eyes. Burning feeling in your eyes. Eye redness. Swollen eyelids. Blurred vision. How is this diagnosed? This condition is diagnosed based on your symptoms and medical history. Your health care provider may also take a sample of discharge from your eye to find the cause of your infection. How is this treated? This condition may be treated with: Antibiotic eye drops or ointment to clear the infection more quickly and prevent the spread of infection to others. Antibiotic medicines taken by mouth (orally) to treat infections that do not respond to drops or ointments or that last longer than 10 days. Cool, wet cloths (cool compresses) placed on the eyes. Artificial  tears applied 2-6 times a day. Follow these instructions at home: Medicines Take or apply your antibiotic medicine as told by your health care provider. Do not stop using the antibiotic, even if your condition improves, unless directed by your health care provider. Take or apply over-the-counter and prescription medicines only  as told by your health care provider. Be very careful to avoid touching the edge of your eyelid with the eye-drop bottle or the ointment tube when you apply medicines to the affected eye. This will keep you from spreading the infection to your other eye or to other people. Managing discomfort Gently wipe away any drainage from your eye with a warm, wet washcloth or a cotton ball. Apply a clean, cool compress to your eye for 10-20 minutes, 3-4 times a day. General instructions Do not wear contact lenses until the inflammation is gone and your health care provider says it is safe to wear them again. Ask your health care provider how to sterilize or replace your contact lenses before you use them again. Wear glasses until you can resume wearing contact lenses. Avoid wearing eye makeup until the inflammation is gone. Throw away any old eye cosmetics that may be contaminated. Change or wash your pillowcase every day. Do not share towels or washcloths. This may spread the infection. Wash your hands often with soap and water for at least 20 seconds and especially before touching your face or eyes. Use paper towels to dry your hands. Avoid touching or rubbing your eyes. Do not drive or use heavy machinery if your vision is blurred. Contact a health care provider if: You have a fever. Your symptoms do not get better after 10 days. Get help right away if: You have a fever and your symptoms suddenly get worse. You have severe pain when you move your eye. You have facial pain, redness, or swelling. You have a sudden loss of vision. Summary Bacterial conjunctivitis is an infection of the clear membrane that covers the white part of the eye and the inner surface of the eyelid (conjunctiva). Bacterial conjunctivitis spreads easily from eye to eye and from person to person (is contagious). Wash your hands often with soap and water for at least 20 seconds and especially before touching your face or eyes. Use  paper towels to dry your hands. Take or apply your antibiotic medicine as told by your health care provider. Do not stop using the antibiotic even if your condition improves. Contact a health care provider if you have a fever or if your symptoms do not get better after 10 days. Get help right away if you have a sudden loss of vision. This information is not intended to replace advice given to you by your health care provider. Make sure you discuss any questions you have with your health care provider. Document Revised: 06/05/2020 Document Reviewed: 06/05/2020 Elsevier Patient Education  2024 Elsevier Inc.    If you have been instructed to have an in-person evaluation today at a local Urgent Care facility, please use the link below. It will take you to a list of all of our available [facility], including address, phone number and hours of operation. Please do not delay care.   Urgent Cares  If you or a family member do not have a primary care provider, use the link below to schedule a visit and establish care. When you choose a  primary care physician or advanced practice provider, you gain a long-term partner in  health. Find a Primary Care Provider  Learn more about [facility] in-office and virtual care options: [facility] Get Care Now

## 2022-11-24 ENCOUNTER — Ambulatory Visit (INDEPENDENT_AMBULATORY_CARE_PROVIDER_SITE_OTHER): Payer: 59 | Admitting: Internal Medicine

## 2022-11-24 ENCOUNTER — Encounter: Payer: Self-pay | Admitting: Internal Medicine

## 2022-11-24 VITALS — BP 120/76 | HR 86 | Temp 97.9°F | Ht 68.0 in | Wt 231.0 lb

## 2022-11-24 DIAGNOSIS — H5710 Ocular pain, unspecified eye: Secondary | ICD-10-CM | POA: Insufficient documentation

## 2022-11-24 DIAGNOSIS — H5711 Ocular pain, right eye: Secondary | ICD-10-CM

## 2022-11-24 NOTE — Assessment & Plan Note (Signed)
Chronic dry eyes Still with pain after polytrim---and no sig redness now Suggested trying lubricant drops and set up with her eye doctor for a better examination

## 2022-11-24 NOTE — Progress Notes (Signed)
Subjective:    Patient ID: Shari Gallagher, female    DOB: [birth year], 37 y.o.   MRN: [id number]  HPI Here due to persistent conjunctivitis  Started 9 days ago Redness in right eye Started with the drops--polytrim left over--stopped yesterday Only zyrtec is helping with the swelling Waking with gunky stuff in eyes----actually worse on the med  Hasn't used the contact lenses Does take out at night---- uses the 2 week ones Still with swollen lids  Does have allergy symptoms---usually just nasal drainage  Current Outpatient Medications on File Prior to Visit  Medication Sig Dispense Refill   cetirizine (ZYRTEC) 10 MG tablet Take 1 tablet (10 mg total) by mouth daily. 30 tablet 0   No current facility-administered medications on file prior to visit.    Allergies  Allergen Reactions   Latex Rash   Mixed Ragweed Cough    Past Medical History:  Diagnosis Date   Depression    History of hernia repair 06/10/2006   Qualifier: Diagnosis of   By: Jillyn Hidden FNP, Mcarthur Rossetti      Recurrent UTI 12/26/2018   Urinary tract infection     Past Surgical History:  Procedure Laterality Date   HERNIA REPAIR  1995    Family History  Problem Relation Age of Onset   Depression Mother    Hypertension Mother    Depression Father    Diabetes Father    Hyperlipidemia Father    Heart disease Maternal Grandfather    Breast cancer Paternal Grandmother    Lung cancer Paternal Grandfather    Heart disease Paternal Grandfather     Social History   Socioeconomic History   Marital status: Single    Spouse name: Not on file   Number of children: Not on file   Years of education: Not on file   Highest education level: Not on file  Occupational History   Not on file  Tobacco Use   Smoking status: Every Day    Current packs/day: 1.00    Types: Cigarettes   Smokeless tobacco: Never  Substance and Sexual Activity   Alcohol use: No   Drug use: Not on file   Sexual activity: Never     Birth control/protection: None  Other Topics Concern   Not on file  Social History Narrative   Not on file   Social Determinants of Health   Financial Resource Strain: Not on file  Food Insecurity: Not on file  Transportation Needs: Not on file  Physical Activity: Not on file  Stress: Not on file  Social Connections: Unknown (07/12/2021)   [facility], [facility]   Social Network    Social Network: Not on file  Intimate Partner Violence: Unknown (06/10/2021)   Received from [facility], [facility]   HITS    Physically Hurt: Not on file    Insult or Talk Down To: Not on file    Threaten Physical Harm: Not on file    Scream or Curse: Not on file   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  Eyes:     Comments: Very slight conjunctival injection on right No edema No lid findings No obvious corneal irregularlities  Neurological:     Mental Status: She is alert.  Assessment & Plan:

## 2022-12-30 ENCOUNTER — Ambulatory Visit (INDEPENDENT_AMBULATORY_CARE_PROVIDER_SITE_OTHER)
Admission: RE | Admit: 2022-12-30 | Discharge: 2022-12-30 | Disposition: A | Discharge status: Home / Self Care | Payer: 59 | Source: Ambulatory Visit | Attending: Family | Admitting: Family

## 2022-12-30 ENCOUNTER — Ambulatory Visit: Payer: 59 | Admitting: Family

## 2022-12-30 VITALS — BP 106/64 | HR 69 | Temp 98.0°F | Ht 68.0 in | Wt 233.4 lb

## 2022-12-30 DIAGNOSIS — M2011 Hallux valgus (acquired), right foot: Secondary | ICD-10-CM | POA: Diagnosis not present

## 2022-12-30 DIAGNOSIS — M79671 Pain in right foot: Secondary | ICD-10-CM | POA: Diagnosis not present

## 2022-12-30 DIAGNOSIS — M7989 Other specified soft tissue disorders: Secondary | ICD-10-CM | POA: Diagnosis not present

## 2022-12-30 NOTE — Patient Instructions (Addendum)
  Recommend tylenol 500 mg and ibuprofen 600 mg together for pain every 6-8 hours.  Exercises to area as tolerated.   A referral was placed today for podiatry  Please let us know if you have not heard back within 2 weeks about the referral.

## 2022-12-30 NOTE — Assessment & Plan Note (Signed)
Suspected plantar fascitis Rehab exercises printed and given to pt Advised on proper footwear, night splints to try if needed. Trial ibuprofen/tylenol combo, ice to site Referral to podiatry as well may need corticosteroid shot.   Xray today to r/o acute findings and or bone spur

## 2022-12-30 NOTE — Progress Notes (Signed)
   Established Patient Office Visit  Subjective:   Patient ID: Shari Gallagher, female    DOB: [birth year]  Age: 37 y.o. MRN: [id number]  CC:  Chief Complaint  Patient presents with   Foot Pain    Heel pain for a few weeks. Feels like this is a plantar fascitis flare up. Has been using ice and ibuprofen with minimal relief.    HPI: Shari Gallagher is a 37 y.o. female presenting on 12/30/2022 for Foot Pain (Heel pain for a few weeks. Feels like this is a plantar fascitis flare up. Has been using ice and ibuprofen with minimal relief.)  For the last few weeks with right heel pain , more so than the left but bil.  States flare up recently in the last week or two, has been going on for years otherwise.   Hurts when bearing weight. 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 Has been applying ice and trying to rub massage the area with mild relief. NSAIDS helping some. She is not stretching. She does state slight bulge base of right foot with bulge.        ROS: Negative unless specifically indicated above in HPI.   Relevant past medical history reviewed and updated as indicated.   Allergies and medications reviewed and updated.   Current Outpatient Medications:    cetirizine (ZYRTEC) 10 MG tablet, Take 1 tablet (10 mg total) by mouth daily., Disp: 30 tablet, Rfl: 0  Allergies  Allergen Reactions   Latex Rash   Mixed Ragweed Cough    Objective:   BP 106/64 (BP Location: Left Arm, Patient Position: Sitting, Cuff Size: Normal)   Pulse 69   Temp 98 F (36.7 C) (Temporal)   Ht 5\' 8"  (1.727 m)   Wt 233 lb 6.4 oz (105.9 kg)   SpO2 99%   BMI 35.49 kg/m    Physical Exam Musculoskeletal:     Right foot: Normal range of motion.     Left foot: Normal range of motion.  Feet:     Comments: Effusion right posterior aspect of heel with spot tenderness on palpation, right side.   Bil ROM pain with foot flexion     Assessment &  Plan:  Right foot pain Assessment & Plan: Suspected plantar fascitis Rehab exercises printed and given to pt Advised on proper footwear, night splints to try if needed. Trial ibuprofen/tylenol combo, ice to site Referral to podiatry as well may need corticosteroid shot.   Xray today to r/o acute findings and or bone spur  Orders: -     Ambulatory referral to Podiatry -     DG Foot Complete Right; Future     Follow up plan: Return for f/u PCP if no improvement in symptoms.  Mort Sawyers, FNP

## 2023-04-23 ENCOUNTER — Telehealth: Payer: Commercial Managed Care - PPO

## 2023-11-09 ENCOUNTER — Encounter: Admitting: Primary Care

## 2023-11-30 ENCOUNTER — Ambulatory Visit (INDEPENDENT_AMBULATORY_CARE_PROVIDER_SITE_OTHER): Admitting: Primary Care

## 2023-11-30 VITALS — BP 108/60 | HR 88 | Temp 97.8°F | Ht 68.0 in | Wt 231.0 lb

## 2023-11-30 DIAGNOSIS — Z0000 Encounter for general adult medical examination without abnormal findings: Secondary | ICD-10-CM

## 2023-11-30 DIAGNOSIS — Z23 Encounter for immunization: Secondary | ICD-10-CM

## 2023-11-30 DIAGNOSIS — Z131 Encounter for screening for diabetes mellitus: Secondary | ICD-10-CM

## 2023-11-30 DIAGNOSIS — F3342 Major depressive disorder, recurrent, in full remission: Secondary | ICD-10-CM

## 2023-11-30 DIAGNOSIS — Z13220 Encounter for screening for lipoid disorders: Secondary | ICD-10-CM | POA: Diagnosis not present

## 2023-11-30 DIAGNOSIS — Z720 Tobacco use: Secondary | ICD-10-CM | POA: Diagnosis not present

## 2023-11-30 MED ORDER — NICOTINE POLACRILEX 2 MG MT GUM: CHEWING_GUM | OROMUCOSAL | 0 refills | Status: AC

## 2023-11-30 MED ORDER — ALBUTEROL SULFATE HFA 108 (90 BASE) MCG/ACT IN AERS: 2.0000 | INHALATION_SPRAY | RESPIRATORY_TRACT | 0 refills | Status: AC | PRN

## 2023-11-30 NOTE — Assessment & Plan Note (Signed)
Overall stable. Continue to monitor.  

## 2023-11-30 NOTE — Assessment & Plan Note (Addendum)
 Since the age 38.  She will start nicotine  gum and Fuma soon. Will refill albuterol  inhaler.   Rx for nicotine  gum sent to pharmacy.

## 2023-11-30 NOTE — Assessment & Plan Note (Signed)
Tetanus vaccine provided today. Pap smear UTD.  Discussed the importance of a healthy diet and regular exercise in order for weight loss, and to reduce the risk of further co-morbidity.  Exam stable. Labs pending.  Follow up in 1 year for repeat physical.

## 2023-11-30 NOTE — Patient Instructions (Signed)
 Stop by the lab prior to leaving today. I will notify you of your results once received.   You may chew 1 piece of the nicotine  gum every 1 hour as needed.  Do not exceed 24 pieces in 24 hours.  Checkout [facility]'s tobacco cessation program.  It was a pleasure to see you today!

## 2023-11-30 NOTE — Progress Notes (Signed)
 Subjective:    Patient ID: Shari Gallagher, female    DOB: May 24, 1985, 38 y.o.   MRN: [id number]  Shari Gallagher is a very pleasant 38 y.o. female who presents today for complete physical and follow up of chronic conditions.  Immunizations: -Tetanus: Completed in 2015 -Influenza: Will get at work -HPV: Completed series  Diet: Fair diet.  Exercise: No regular exercise.  Eye exam: Completes annually  Dental exam: Completes semi-annually    Pap Smear: Completed in 2024      Review of Systems  Constitutional:  Negative for unexpected weight change.  HENT:  Negative for rhinorrhea.   Respiratory:  Negative for cough and shortness of breath.   Cardiovascular:  Negative for chest pain.  Gastrointestinal:  Negative for constipation and diarrhea.  Genitourinary:  Negative for difficulty urinating and menstrual problem.  Musculoskeletal:  Negative for arthralgias and myalgias.  Skin:  Negative for rash.  Allergic/Immunologic: Negative for environmental allergies.  Neurological:  Negative for dizziness and headaches.  Psychiatric/Behavioral:  The patient is not nervous/anxious.          Past Medical History:  Diagnosis Date   Depression    History of hernia repair 06/10/2006   Qualifier: Diagnosis of   By: Baird FNP, Frieda Kipper      Recurrent UTI 12/26/2018   Urinary tract infection     Social History   Socioeconomic History   Marital status: Single    Spouse name: Not on file   Number of children: Not on file   Years of education: Not on file   Highest education level: Bachelor's degree (e.g., BA, AB, BS)  Occupational History   Not on file  Tobacco Use   Smoking status: Every Day    Current packs/day: 1.00    Types: Cigarettes   Smokeless tobacco: Never  Substance and Sexual Activity   Alcohol use: No   Drug use: Not on file   Sexual activity: Never    Birth control/protection: None  Other Topics Concern   Not on file  Social History Narrative    Not on file   Social Drivers of Health   Financial Resource Strain: Low Risk  (11/30/2023)   Overall Financial Resource Strain (CARDIA)    Difficulty of Paying Living Expenses: Not hard at all  Food Insecurity: No Food Insecurity (11/30/2023)   Hunger Vital Sign    Worried About Running Out of Food in the Last Year: Never true    Ran Out of Food in the Last Year: Never true  Transportation Needs: No Transportation Needs (11/30/2023)   PRAPARE - Administrator, Civil Service (Medical): No    Lack of Transportation (Non-Medical): No  Physical Activity: Sufficiently Active (11/30/2023)   Exercise Vital Sign    Days of Exercise per Week: 4 days    Minutes of Exercise per Session: 150+ min  Stress: Stress Concern Present (11/30/2023)   [employer] of Occupational Health - Occupational Stress Questionnaire    Feeling of Stress: To some extent  Social Connections: Unknown (11/30/2023)   Social Connection and Isolation Panel    Frequency of Communication with Friends and Family: More than three times a week    Frequency of Social Gatherings with Friends and Family: Three times a week    Attends Religious Services: Patient declined    Active Member of Clubs or Organizations: No    Attends Banker Meetings: Not on file    Marital Status: Patient declined  Intimate Partner Violence: Unknown (06/10/2021)   Received from [facility]   HITS    Physically Hurt: Not on file    Insult or Talk Down To: Not on file    Threaten Physical Harm: Not on file    Scream or Curse: Not on file    Past Surgical History:  Procedure Laterality Date   HERNIA REPAIR  1995    Family History  Problem Relation Age of Onset   Depression Mother    Hypertension Mother    Depression Father    Diabetes Father    Hyperlipidemia Father    Heart disease Maternal Grandfather    Breast cancer Paternal Grandmother    Lung cancer Paternal Grandfather    Heart disease Paternal  Grandfather     Allergies  Allergen Reactions   Latex Rash   Mixed Ragweed Cough    Current Outpatient Medications on File Prior to Visit  Medication Sig Dispense Refill   cetirizine  (ZYRTEC ) 10 MG tablet Take 1 tablet (10 mg total) by mouth daily. 30 tablet 0   No current facility-administered medications on file prior to visit.    BP 108/60   Pulse 88   Temp 97.8 F (36.6 C) (Temporal)   Ht 5' 8 (1.727 m)   Wt 231 lb (104.8 kg)   LMP 11/22/2023   SpO2 98%   BMI 35.12 kg/m  Objective:   Physical Exam HENT:     Right Ear: Tympanic membrane and ear canal normal.     Left Ear: Tympanic membrane and ear canal normal.  Eyes:     Pupils: Pupils are equal, round, and reactive to light.  Cardiovascular:     Rate and Rhythm: Normal rate and regular rhythm.  Pulmonary:     Effort: Pulmonary effort is normal.     Breath sounds: Normal breath sounds.  Abdominal:     General: Bowel sounds are normal.     Palpations: Abdomen is soft.     Tenderness: There is no abdominal tenderness.  Musculoskeletal:        General: Normal range of motion.     Cervical back: Neck supple.  Skin:    General: Skin is warm and dry.  Neurological:     Mental Status: She is alert and oriented to person, place, and time.     Cranial Nerves: No cranial nerve deficit.     Deep Tendon Reflexes:     Reflex Scores:      Patellar reflexes are 2+ on the right side and 2+ on the left side. Psychiatric:        Mood and Affect: Mood normal.     Physical Exam        Assessment & Plan:  Preventative health care Assessment & Plan: Tetanus vaccine provided today. Pap smear UTD.  Discussed the importance of a healthy diet and regular exercise in order for weight loss, and to reduce the risk of further co-morbidity.  Exam stable. Labs pending.  Follow up in 1 year for repeat physical.   Orders: -     TSH -     Hemoglobin A1c -     Comprehensive metabolic panel with GFR -     Lipid  panel  Tobacco abuse Assessment & Plan: Since the age 56.  She will start nicotine  gum and Fuma soon. Will refill albuterol  inhaler.   Rx for nicotine  gum sent to pharmacy.  Orders: -     Albuterol  Sulfate HFA; Inhale 2 puffs  into the lungs every 4 (four) hours as needed for shortness of breath.  Dispense: 1 each; Refill: 0 -     Nicotine  Polacrilex; Chew 1 piece every 1 hour as needed. Do not exceed 24 pieces in 24 hours.  Dispense: 100 tablet; Refill: 0  Recurrent major depressive disorder, in full remission Assessment & Plan: Overall stable. Continue to monitor.     Assessment and Plan Assessment & Plan         Comer MARLA Gaskins, NP   History of Present Illness

## 2023-12-01 ENCOUNTER — Ambulatory Visit: Payer: Self-pay | Admitting: Primary Care

## 2023-12-01 DIAGNOSIS — R7303 Prediabetes: Secondary | ICD-10-CM

## 2023-12-01 LAB — COMPREHENSIVE METABOLIC PANEL WITH GFR
ALT: 29 U/L (ref 0–35)
AST: 21 U/L (ref 0–37)
Albumin: 4.4 g/dL (ref 3.5–5.2)
Alkaline Phosphatase: 65 U/L (ref 39–117)
BUN: 9 mg/dL (ref 6–23)
CO2: 28 meq/L (ref 19–32)
Calcium: 9 mg/dL (ref 8.4–10.5)
Chloride: 102 meq/L (ref 96–112)
Creatinine, Ser: 0.69 mg/dL (ref 0.40–1.20)
GFR: 110.97 mL/min (ref >60.00)
Glucose, Bld: 84 mg/dL (ref 70–99)
Potassium: 4.4 meq/L (ref 3.5–5.1)
Sodium: 138 meq/L (ref 135–145)
Total Bilirubin: 0.5 mg/dL (ref 0.2–1.2)
Total Protein: 6.8 g/dL (ref 6.0–8.3)

## 2023-12-01 LAB — LIPID PANEL
Cholesterol: 211 mg/dL — ABNORMAL HIGH (ref 0–200)
HDL: 44.2 mg/dL (ref >39.00)
LDL Cholesterol: 136 mg/dL — ABNORMAL HIGH (ref 0–99)
NonHDL: 167.18
Total CHOL/HDL Ratio: 5
Triglycerides: 155 mg/dL — ABNORMAL HIGH (ref 0.0–149.0)
VLDL: 31 mg/dL (ref 0.0–40.0)

## 2023-12-01 LAB — HEMOGLOBIN A1C: Hgb A1c MFr Bld: 5.7 % (ref 4.6–6.5)

## 2023-12-01 LAB — TSH: TSH: 1.06 u[IU]/mL (ref 0.35–5.50)

## 2023-12-12 ENCOUNTER — Telehealth: Admitting: Family Medicine

## 2023-12-12 DIAGNOSIS — Z8616 Personal history of COVID-19: Secondary | ICD-10-CM | POA: Diagnosis not present

## 2023-12-12 DIAGNOSIS — J4540 Moderate persistent asthma, uncomplicated: Secondary | ICD-10-CM | POA: Diagnosis not present

## 2023-12-12 MED ORDER — AZITHROMYCIN 250 MG PO TABS: ORAL_TABLET | ORAL | 0 refills | Status: AC

## 2023-12-12 NOTE — Progress Notes (Signed)
 Virtual Visit Consent   [name], you are scheduled for a virtual visit with a [facility] provider today. Just as with appointments in the office, your consent must be obtained to participate. Your consent will be active for this visit and any virtual visit you may have with one of our providers in the next 365 days. If you have a MyChart account, a copy of this consent can be sent to you electronically.  As this is a virtual visit, video technology does not allow for your provider to perform a traditional examination. This may limit your provider's ability to fully assess your condition. If your provider identifies any concerns that need to be evaluated in person or the need to arrange testing (such as labs, EKG, etc.), we will make arrangements to do so. Although advances in technology are sophisticated, we cannot ensure that it will always work on either your end or our end. If the connection with a video visit is poor, the visit may have to be switched to a telephone visit. With either a video or telephone visit, we are not always able to ensure that we have a secure connection.  By engaging in this virtual visit, you consent to the provision of healthcare and authorize for your insurance to be billed (if applicable) for the services provided during this visit. Depending on your insurance coverage, you may receive a charge related to this service.  I need to obtain your verbal consent now. Are you willing to proceed with your visit today? Shari Gallagher has provided verbal consent on 12/12/2023 for a virtual visit (video or telephone). Shari Lamp, FNP  Date: 12/12/2023 3:03 PM   Virtual Visit via Video Note   I, Shari Gallagher, connected with  Shari Gallagher  ([id number], 01-31-1986) on 12/12/23 at  3:00 PM EDT by a video-enabled telemedicine application and verified that I am speaking with the correct person using two identifiers.  Location: Patient: Virtual Visit Location Patient:  Home Provider: Virtual Visit Location Provider: Home Office   I discussed the limitations of evaluation and management by telemedicine and the availability of in person appointments. The patient expressed understanding and agreed to proceed.    History of Present Illness: Shari Gallagher is a 38 y.o. who identifies as a female who was assigned female at birth, and is being seen today for cough, wheezing, runny noise. She had covid a couple of weeks ago. She has abuterol but does not want prednisone  HPI: HPI  Problems:  Patient Active Problem List   Diagnosis Date Noted   Tobacco abuse 11/30/2023   Preventative health care 05/11/2022   Menorrhagia with regular cycle 10/09/2019   Depression 05/12/2005    Allergies:  Allergies  Allergen Reactions   Latex Rash   Mixed Ragweed Cough   Medications:  Current Outpatient Medications:    albuterol  (VENTOLIN  HFA) 108 (90 Base) MCG/ACT inhaler, Inhale 2 puffs into the lungs every 4 (four) hours as needed for shortness of breath., Disp: 1 each, Rfl: 0   cetirizine  (ZYRTEC ) 10 MG tablet, Take 1 tablet (10 mg total) by mouth daily., Disp: 30 tablet, Rfl: 0   nicotine  polacrilex ([employer] NICOTINE ) 2 MG gum, Chew 1 piece every 1 hour as needed. Do not exceed 24 pieces in 24 hours., Disp: 100 tablet, Rfl: 0  Observations/Objective: Patient is well-developed, well-nourished in no acute distress.  Resting comfortably  at home.  Head is normocephalic, atraumatic.  No labored breathing.  Speech is clear  and coherent with logical content.  Patient is alert and oriented at baseline.    Assessment and Plan: 1. History of COVID-19 (Primary)  2. Moderate persistent asthmatic bronchitis without complication  Increase fluids, uc as neeed  Follow Up Instructions: I discussed the assessment and treatment plan with the patient. The patient was provided an opportunity to ask questions and all were answered. The patient agreed with the plan and demonstrated an  understanding of the instructions.  A copy of instructions were sent to the patient via MyChart unless otherwise noted below.     The patient was advised to call back or seek an in-person evaluation if the symptoms worsen or if the condition fails to improve as anticipated.    Elivia Robotham, FNP

## 2023-12-12 NOTE — Patient Instructions (Signed)

## 2023-12-14 ENCOUNTER — Other Ambulatory Visit: Payer: Self-pay | Admitting: Primary Care

## 2023-12-14 DIAGNOSIS — Z720 Tobacco use: Secondary | ICD-10-CM

## 2024-02-21 DIAGNOSIS — H5213 Myopia, bilateral: Secondary | ICD-10-CM | POA: Diagnosis not present

## 2024-04-12 ENCOUNTER — Telehealth: Admitting: Nurse Practitioner

## 2024-04-12 DIAGNOSIS — H01001 Unspecified blepharitis right upper eyelid: Secondary | ICD-10-CM

## 2024-04-12 MED ORDER — ERYTHROMYCIN 5 MG/GM OP OINT: 1.0000 | TOPICAL_OINTMENT | Freq: Every day | OPHTHALMIC | 0 refills | Status: AC

## 2024-04-12 NOTE — Progress Notes (Signed)
 " Virtual Visit Consent   Shari Gallagher, you are scheduled for a virtual visit with a [facility] provider today. Just as with appointments in the office, your consent must be obtained to participate. Your consent will be active for this visit and any virtual visit you may have with one of our providers in the next 365 days. If you have a MyChart account, a copy of this consent can be sent to you electronically.  As this is a virtual visit, video technology does not allow for your provider to perform a traditional examination. This may limit your provider's ability to fully assess your condition. If your provider identifies any concerns that need to be evaluated in person or the need to arrange testing (such as labs, EKG, etc.), we will make arrangements to do so. Although advances in technology are sophisticated, we cannot ensure that it will always work on either your end or our end. If the connection with a video visit is poor, the visit may have to be switched to a telephone visit. With either a video or telephone visit, we are not always able to ensure that we have a secure connection.  By engaging in this virtual visit, you consent to the provision of healthcare and authorize for your insurance to be billed (if applicable) for the services provided during this visit. Depending on your insurance coverage, you may receive a charge related to this service.  I need to obtain your verbal consent now. Are you willing to proceed with your visit today? Shari Gallagher has provided verbal consent on 04/12/2024 for a virtual visit (video or telephone). Shari JINNY Sierras, NP  Date: 04/12/2024 10:08 AM   Virtual Visit via Video Note   I, Shari Gallagher, connected with  Shari Gallagher  ([id number], [birth year]) on 04/12/24 at 10:15 AM EST by a video-enabled telemedicine application and verified that I am speaking with the correct person using two identifiers.  Location: Patient: Virtual Visit  Location Patient: Home Provider: Virtual Visit Location Provider: Home Office   I discussed the limitations of evaluation and management by telemedicine and the availability of in person appointments. The patient expressed understanding and agreed to proceed.    History of Present Illness: Shari Gallagher is a 39 y.o. who identifies as a female who was assigned female at birth, and is being seen today for right upper eyelid swelling and redness that has been present for the past 3 days. Denies periorbital paim/swelling, eye drainage, visual changes, injury or trauma. Endorses light sensitivity. Reports she wears contacts, but has been wearing eyeglasses since her symptoms started. She uses lumify eye drops daily.   HPI: HPI  Problems:  Patient Active Problem List   Diagnosis Date Noted   Tobacco abuse 11/30/2023   Preventative health care 05/11/2022   Menorrhagia with regular cycle 10/09/2019   Depression 05/12/2005    Allergies: Allergies[1] Medications: Current Medications[2]  Observations/Objective: Patient is well-developed, well-nourished in no acute distress.  Resting comfortably  at home.  Head is normocephalic, atraumatic.  No labored breathing.  Speech is clear and coherent with logical content.  Patient is alert and oriented at baseline.    Assessment and Plan: 1. Blepharitis of right upper eyelid, unspecified type (Primary) - erythromycin  ophthalmic ointment; Place 1 Application into the right eye at bedtime for 7 days. Apply a 1 ribbon to the right eye daily for 7 days.  Dispense: 7 g; Refill: 0  Use medication as prescribed. Apply cool  compresses to the eyes to help with pain or swelling, warm compresses to help with pain or discomfort.  Continue current eyedrops as discussed. Strict handwashing when applying medication.  Avoid rubbing or manipulating the eyes while symptoms persist. Discard any eye make-up that were using prior to your symptoms starting. Continue  wearing your eyeglasses until your symptoms improve.  Follow-up if you develop worsening symptoms or new symptoms of visual changes, pain or swelling around the eye or other concerns.   Follow Up Instructions: I discussed the assessment and treatment plan with the patient. The patient was provided an opportunity to ask questions and all were answered. The patient agreed with the plan and demonstrated an understanding of the instructions.  A copy of instructions were sent to the patient via MyChart unless otherwise noted below.     The patient was advised to call back or seek an in-person evaluation if the symptoms worsen or if the condition fails to improve as anticipated.    Shari JINNY Sierras, NP    [1]  Allergies Allergen Reactions   Latex Rash   Mixed Ragweed Cough  [2]  Current Outpatient Medications:    albuterol  (VENTOLIN  HFA) 108 (90 Base) MCG/ACT inhaler, Inhale 2 puffs into the lungs every 4 (four) hours as needed for shortness of breath., Disp: 1 each, Rfl: 0   cetirizine  (ZYRTEC ) 10 MG tablet, Take 1 tablet (10 mg total) by mouth daily., Disp: 30 tablet, Rfl: 0   nicotine  polacrilex ([employer] NICOTINE ) 2 MG gum, Chew 1 piece every 1 hour as needed. Do not exceed 24 pieces in 24 hours., Disp: 100 tablet, Rfl: 0  "

## 2024-04-12 NOTE — Patient Instructions (Signed)
" °  Shari Gallagher, thank you for joining [name], NP for today's virtual visit.  While this provider is not your primary care provider (PCP), if your PCP is located in our provider database this encounter information will be shared with them immediately following your visit.   A [facility] MyChart account gives you access to today's visit and all your visits, tests, and labs performed at [facility]  click here if you don't have a [facility] MyChart account or go to [email or web address]  Consent: (Patient) Shari Gallagher provided verbal consent for this virtual visit at the beginning of the encounter.  Current Medications:  Current Outpatient Medications:    erythromycin  ophthalmic ointment, Place 1 Application into the right eye at bedtime for 7 days. Apply a 1 ribbon to the right eye daily for 7 days., Disp: 7 g, Rfl: 0   albuterol  (VENTOLIN  HFA) 108 (90 Base) MCG/ACT inhaler, Inhale 2 puffs into the lungs every 4 (four) hours as needed for shortness of breath., Disp: 1 each, Rfl: 0   cetirizine  (ZYRTEC ) 10 MG tablet, Take 1 tablet (10 mg total) by mouth daily., Disp: 30 tablet, Rfl: 0   nicotine  polacrilex ([employer] NICOTINE ) 2 MG gum, Chew 1 piece every 1 hour as needed. Do not exceed 24 pieces in 24 hours., Disp: 100 tablet, Rfl: 0   Medications ordered in this encounter:  Meds ordered this encounter  Medications   erythromycin  ophthalmic ointment    Sig: Place 1 Application into the right eye at bedtime for 7 days. Apply a 1 ribbon to the right eye daily for 7 days.    Dispense:  7 g    Refill:  0     *If you need refills on other medications prior to your next appointment, please contact your pharmacy*  Follow-Up: Call back or seek an in-person evaluation if the symptoms worsen or if the condition fails to improve as anticipated.     Other Instructions Use medication as prescribed. Apply cool compresses to the eyes to help with pain or  swelling, warm compresses to help with pain or discomfort.  Continue current eyedrops as discussed. Strict handwashing when applying medication.  Avoid rubbing or manipulating the eyes while symptoms persist. Discard any eye make-up that were using prior to your symptoms starting. Continue wearing your eyeglasses until your symptoms improve.  Follow-up if you develop worsening symptoms or new symptoms of visual changes, pain or swelling around the eye or other concerns.    If you have been instructed to have an in-person evaluation today at a local Urgent Care facility, please use the link below. It will take you to a list of all of our available [facility], including address, phone number and hours of operation. Please do not delay care.  [facility]  If you or a family member do not have a primary care provider, use the link below to schedule a visit and establish care. When you choose a [facility] primary care physician or advanced practice provider, you gain a long-term partner in health. Find a Primary Care Provider  Learn more about [facility]'s in-office and virtual care options: [facility] - Get Care Now  "
